# Patient Record
Sex: Male | Born: 1950 | Race: Black or African American | Hispanic: No | Smoking: Current every day smoker
Health system: Southern US, Community
[De-identification: ages and names within clinical notes are randomized; demographics above are authoritative.]

## PROBLEM LIST (undated history)

## (undated) DIAGNOSIS — I10 Essential (primary) hypertension: Secondary | ICD-10-CM

## (undated) DIAGNOSIS — N289 Disorder of kidney and ureter, unspecified: Secondary | ICD-10-CM

## (undated) DIAGNOSIS — B1920 Unspecified viral hepatitis C without hepatic coma: Secondary | ICD-10-CM

## (undated) DIAGNOSIS — N183 Chronic kidney disease, stage 3 unspecified: Secondary | ICD-10-CM

## (undated) HISTORY — PX: FOOT FRACTURE SURGERY: SHX645

---

## 2000-10-30 ENCOUNTER — Encounter: Payer: Self-pay | Admitting: Emergency Medicine

## 2000-10-30 ENCOUNTER — Emergency Department (HOSPITAL_COMMUNITY): Admission: EM | Admit: 2000-10-30 | Discharge: 2000-10-30 | Payer: Self-pay | Admitting: Emergency Medicine

## 2003-09-07 ENCOUNTER — Emergency Department (HOSPITAL_COMMUNITY): Admission: EM | Admit: 2003-09-07 | Discharge: 2003-09-07 | Payer: Self-pay | Admitting: Family Medicine

## 2003-11-02 ENCOUNTER — Encounter: Admission: RE | Admit: 2003-11-02 | Discharge: 2003-11-02 | Payer: Self-pay | Admitting: Family Medicine

## 2010-12-24 ENCOUNTER — Inpatient Hospital Stay (INDEPENDENT_AMBULATORY_CARE_PROVIDER_SITE_OTHER)
Admission: RE | Admit: 2010-12-24 | Discharge: 2010-12-24 | Disposition: A | Discharge status: Home / Self Care | Payer: Self-pay | Source: Ambulatory Visit | Attending: Family Medicine | Admitting: Family Medicine

## 2010-12-24 DIAGNOSIS — Z760 Encounter for issue of repeat prescription: Secondary | ICD-10-CM

## 2013-07-29 ENCOUNTER — Ambulatory Visit: Payer: Self-pay | Admitting: Family Medicine

## 2013-07-29 VITALS — BP 124/80 | HR 90 | Temp 98.0°F | Resp 16 | Ht 68.5 in | Wt 191.0 lb

## 2013-07-29 DIAGNOSIS — Z0289 Encounter for other administrative examinations: Secondary | ICD-10-CM

## 2013-07-29 NOTE — Patient Instructions (Signed)
You did have protein and some blood noted on your screening urine test today, but blood pressure is controlled. Follow up with primary care provoder and kidney doctor as scheduled. Return to the clinic or go to the nearest emergency room if any of your symptoms worsen or new symptoms occur. 1 year card. Try to bring a letter from your nephrologist regarding the status of your kidney disease to your next DOT physical.

## 2013-07-29 NOTE — Progress Notes (Addendum)
Subjective:    Patient ID: Nicholas Roach, male    DOB: [birth year], 63 y.o.   MRN: [id number] This chart was scribed for Merri Ray, MD by Vernell Barrier, Medical Scribe. This patient's care was started at 10:34 AM.  HPI HPI Comments: Nicholas Roach is a 63 y.o. male who presents to the [facility] in need of an employment physical. Pt has HTN and Kidney disease. Pt has been taking medication for both. He reports he currently has 40-45% function of his kidneys, and has been stable without new meds . Pt checks blood pressure regularly, usually runs around 120/90. Pt was diagnosed with a heart murmur last year by his PCP; states he was told it was nothing to worry about. Denies lightheadedness, dizziness with taking medication. Pt denies chest pain, SOB, trouble breathing, weakness, abdominal pain. Pt wears glasses and denies any difficulty seeing at night or any glares. Pt denies any difficulty sleeping, snoring, or apnea. Pt denies hx of heart disease, or eye disease.    There are no active problems to display for this patient.  No past medical history on file. No past surgical history on file. No Known Allergies Prior to Admission medications   Medication Sig Start Date End Date Taking? Authorizing Provider  amLODipine (NORVASC) 10 MG tablet Take 10 mg by mouth daily.   Yes Historical Provider, MD  aspirin 81 MG tablet Take 81 mg by mouth daily.   Yes Historical Provider, MD  omega-3 acid ethyl esters (LOVAZA) 1 G capsule Take by mouth 2 (two) times daily.   Yes Historical Provider, MD  sertraline (ZOLOFT) 100 MG tablet Take 100 mg by mouth daily.   Yes Historical Provider, MD   History   Social History  . Marital Status: Legally Separated    Spouse Name: N/A    Number of Children: N/A  . Years of Education: N/A   Occupational History  . Not on file.   Social History Main Topics  . Smoking status: Former Research scientist (life sciences)  . Smokeless tobacco: Not on file  .  Alcohol Use: Not on file  . Drug Use: Not on file  . Sexual Activity: Not on file   Other Topics Concern  . Not on file   Social History Narrative  . No narrative on file    Review of Systems  Respiratory: Negative for apnea and shortness of breath.   Cardiovascular: Negative for chest pain.  Gastrointestinal: Negative for abdominal pain.  Neurological: Negative for dizziness and light-headedness.   Negative other than as listed on medical exam report history. See scanned copy.     Objective:   Physical Exam  Vitals reviewed. Constitutional: He is oriented to person, place, and time. He appears well-developed and well-nourished.  HENT:  Head: Normocephalic and atraumatic.  Right Ear: External ear normal.  Left Ear: External ear normal.  Mouth/Throat: Oropharynx is clear and moist.  Eyes: Conjunctivae and EOM are normal. Pupils are equal, round, and reactive to light.  Neck: Normal range of motion. Neck supple. No JVD present. Carotid bruit is not present. No thyromegaly present.  Cardiovascular: Normal rate, regular rhythm and intact distal pulses.   Murmur (1-2 of 6, best heard at left sternal border) heard. Pulmonary/Chest: Effort normal and breath sounds normal. No respiratory distress. He has no wheezes. He has no rales.  Abdominal: Soft. He exhibits no distension. There is no tenderness. Hernia confirmed negative in the right inguinal area and confirmed negative in  the left inguinal area.  Genitourinary: Prostate normal.  Musculoskeletal: Normal range of motion. He exhibits no edema and no tenderness.  Lymphadenopathy:    He has no cervical adenopathy.  Neurological: He is alert and oriented to person, place, and time. He has normal reflexes.  Skin: Skin is warm and dry.  Psychiatric: He has a normal mood and affect. His behavior is normal.    Filed Vitals:   07/29/13 1020  BP: 124/80  Pulse: 90  Temp: 98 F (36.7 C)  TempSrc: Oral  Resp: 16  Height: 5' 8.5"  (1.74 m)  Weight: 191 lb (86.637 kg)  SpO2: 96%    Visual Acuity Screening   Right eye Left eye Both eyes  Without correction:     With correction: 20/20 20/15 20/13   Comments: Peripheral vision 85 degrees both eyesColor test 8 colors identified  Hearing Screening Comments: Whisper test 9 feet both ears        Assessment & Plan:   Nicholas Roach is a 63 y.o. male Health examination of defined subpopulation  DOT physical - see ppwk. 1 year card with htn ,and CKD - stable by report.  Discussed proteinuria, hematuria, and follow up with PCP/nephrologist.   Patient Instructions  You did have protein and some blood noted on your screening urine test today, but blood pressure is controlled. Follow up with primary care provoder and kidney doctor as scheduled. Return to the clinic or go to the nearest emergency room if any of your symptoms worsen or new symptoms occur. 1 year card. Try to bring a letter from your nephrologist regarding the status of your kidney disease to your next DOT physical.   I personally performed the services described in this documentation, which was scribed in my presence. The recorded information has been reviewed and considered, and addended by me as needed.

## 2014-02-18 ENCOUNTER — Emergency Department (HOSPITAL_COMMUNITY)
Admission: EM | Admit: 2014-02-18 | Discharge: 2014-02-18 | Disposition: A | Discharge status: Home / Self Care | Payer: Non-veteran care | Attending: Emergency Medicine | Admitting: Emergency Medicine

## 2014-02-18 ENCOUNTER — Encounter (HOSPITAL_COMMUNITY): Payer: Self-pay | Admitting: Emergency Medicine

## 2014-02-18 ENCOUNTER — Emergency Department (HOSPITAL_COMMUNITY): Payer: Non-veteran care

## 2014-02-18 DIAGNOSIS — Z79899 Other long term (current) drug therapy: Secondary | ICD-10-CM | POA: Insufficient documentation

## 2014-02-18 DIAGNOSIS — N183 Chronic kidney disease, stage 3 unspecified: Secondary | ICD-10-CM | POA: Insufficient documentation

## 2014-02-18 DIAGNOSIS — I129 Hypertensive chronic kidney disease with stage 1 through stage 4 chronic kidney disease, or unspecified chronic kidney disease: Secondary | ICD-10-CM | POA: Insufficient documentation

## 2014-02-18 DIAGNOSIS — M549 Dorsalgia, unspecified: Secondary | ICD-10-CM | POA: Insufficient documentation

## 2014-02-18 DIAGNOSIS — F17200 Nicotine dependence, unspecified, uncomplicated: Secondary | ICD-10-CM | POA: Insufficient documentation

## 2014-02-18 DIAGNOSIS — Z7982 Long term (current) use of aspirin: Secondary | ICD-10-CM | POA: Insufficient documentation

## 2014-02-18 DIAGNOSIS — M545 Low back pain, unspecified: Secondary | ICD-10-CM | POA: Insufficient documentation

## 2014-02-18 DIAGNOSIS — Z8619 Personal history of other infectious and parasitic diseases: Secondary | ICD-10-CM | POA: Insufficient documentation

## 2014-02-18 HISTORY — DX: Unspecified viral hepatitis C without hepatic coma: B19.20

## 2014-02-18 HISTORY — DX: Disorder of kidney and ureter, unspecified: N28.9

## 2014-02-18 HISTORY — DX: Essential (primary) hypertension: I10

## 2014-02-18 HISTORY — DX: Chronic kidney disease, stage 3 unspecified: N18.30

## 2014-02-18 HISTORY — DX: Chronic kidney disease, stage 3 (moderate): N18.3

## 2014-02-18 LAB — CBC WITH DIFFERENTIAL/PLATELET
Basophils Absolute: 0 10*3/uL (ref 0.0–0.1)
Basophils Relative: 1 % (ref 0–1)
Eosinophils Absolute: 0.2 10*3/uL (ref 0.0–0.7)
Eosinophils Relative: 2 % (ref 0–5)
HEMATOCRIT: 38.9 % — AB (ref 39.0–52.0)
Hemoglobin: 13.6 g/dL (ref 13.0–17.0)
LYMPHS PCT: 29 % (ref 12–46)
Lymphs Abs: 1.8 10*3/uL (ref 0.7–4.0)
MCH: 30 pg (ref 26.0–34.0)
MCHC: 35 g/dL (ref 30.0–36.0)
MCV: 85.9 fL (ref 78.0–100.0)
MONO ABS: 0.5 10*3/uL (ref 0.1–1.0)
MONOS PCT: 8 % (ref 3–12)
Neutro Abs: 3.8 10*3/uL (ref 1.7–7.7)
Neutrophils Relative %: 60 % (ref 43–77)
Platelets: 171 10*3/uL (ref 150–400)
RBC: 4.53 MIL/uL (ref 4.22–5.81)
RDW: 14.9 % (ref 11.5–15.5)
WBC: 6.3 10*3/uL (ref 4.0–10.5)

## 2014-02-18 LAB — BASIC METABOLIC PANEL
Anion gap: 10 (ref 5–15)
BUN: 29 mg/dL — AB (ref 6–23)
CHLORIDE: 102 meq/L (ref 96–112)
CO2: 26 meq/L (ref 19–32)
CREATININE: 2.51 mg/dL — AB (ref 0.50–1.35)
Calcium: 9.2 mg/dL (ref 8.4–10.5)
GFR calc Af Amer: 30 mL/min — ABNORMAL LOW (ref >90)
GFR calc non Af Amer: 26 mL/min — ABNORMAL LOW (ref >90)
Glucose, Bld: 97 mg/dL (ref 70–99)
Potassium: 4.5 mEq/L (ref 3.7–5.3)
Sodium: 138 mEq/L (ref 137–147)

## 2014-02-18 MED ORDER — HYDROCODONE-ACETAMINOPHEN 5-325 MG PO TABS: 2.0000 | ORAL_TABLET | Freq: Once | ORAL | Status: DC

## 2014-02-18 MED ORDER — HYDROMORPHONE HCL PF 1 MG/ML IJ SOLN
1.0000 mg | Freq: Once | INTRAMUSCULAR | Status: AC
  Administered 2014-02-18: 1 mg via INTRAMUSCULAR
  Filled 2014-02-18: qty 1

## 2014-02-18 MED ORDER — DIAZEPAM 5 MG PO TABS: 5.0000 mg | ORAL_TABLET | Freq: Once | ORAL | Status: DC

## 2014-02-18 MED ORDER — CYCLOBENZAPRINE HCL 10 MG PO TABS
10.0000 mg | ORAL_TABLET | Freq: Once | ORAL | Status: AC
  Administered 2014-02-18: 10 mg via ORAL
  Filled 2014-02-18: qty 1

## 2014-02-18 MED ORDER — OXYCODONE-ACETAMINOPHEN 5-325 MG PO TABS: 1.0000 | ORAL_TABLET | Freq: Four times a day (QID) | ORAL | Status: DC | PRN

## 2014-02-18 MED ORDER — HYDROCODONE-ACETAMINOPHEN 5-325 MG PO TABS
1.0000 | ORAL_TABLET | Freq: Once | ORAL | Status: AC
  Administered 2014-02-18: 1 via ORAL
  Filled 2014-02-18: qty 1

## 2014-02-18 MED ORDER — DIAZEPAM 5 MG PO TABS: 5.0000 mg | ORAL_TABLET | Freq: Two times a day (BID) | ORAL | Status: DC

## 2014-02-18 NOTE — ED Notes (Signed)
Bed: YB33 Expected date:  Expected time:  Means of arrival:  Comments: EMS 63yo M rt lower back / hip pain

## 2014-02-18 NOTE — ED Notes (Signed)
Pt ambulated to and from bathroom without assistance.  Pt c/o pain and spasms with movement.  Pt's gait was steady.

## 2014-02-18 NOTE — ED Provider Notes (Signed)
Patient signed out to me by Deborah Chalk, PA-C at change of shift. Patient with back pain awaiting MRI. Likely discharge home.   MRI results show large right foraminal disc extrusion at L2-3 affecting the right L2 nerve. This likely the cause of patient's pain. Patient reexamined. He is uncomfortable feeling without neuro deficit. Patient reports too much pain to walk. Will attempt more aggressive pain control.   Patient given Dilaudid 1mg  IM. Patient feels improved and is able to ambulate, but with pain. Will discharge home with percocet and valium. Patient has follow up at the [facility]. Orthopedic follow up given. Dicussed reasons to return to ED immediately. Vital signs stable for discharge. Discussed case with Dr. Wilson Singer who agrees with plan. Patient / Family / Caregiver informed of clinical course, understand medical decision-making process, and agree with plan.   Results for orders placed during the hospital encounter of 02/18/14  CBC WITH DIFFERENTIAL      Result Value Ref Range   WBC 6.3  4.0 - 10.5 K/uL   RBC 4.53  4.22 - 5.81 MIL/uL   Hemoglobin 13.6  13.0 - 17.0 g/dL   HCT 38.9 (*) 39.0 - 52.0 %   MCV 85.9  78.0 - 100.0 fL   MCH 30.0  26.0 - 34.0 pg   MCHC 35.0  30.0 - 36.0 g/dL   RDW 14.9  11.5 - 15.5 %   Platelets 171  150 - 400 K/uL   Neutrophils Relative % 60  43 - 77 %   Neutro Abs 3.8  1.7 - 7.7 K/uL   Lymphocytes Relative 29  12 - 46 %   Lymphs Abs 1.8  0.7 - 4.0 K/uL   Monocytes Relative 8  3 - 12 %   Monocytes Absolute 0.5  0.1 - 1.0 K/uL   Eosinophils Relative 2  0 - 5 %   Eosinophils Absolute 0.2  0.0 - 0.7 K/uL   Basophils Relative 1  0 - 1 %   Basophils Absolute 0.0  0.0 - 0.1 K/uL  BASIC METABOLIC PANEL      Result Value Ref Range   Sodium 138  137 - 147 mEq/L   Potassium 4.5  3.7 - 5.3 mEq/L   Chloride 102  96 - 112 mEq/L   CO2 26  19 - 32 mEq/L   Glucose, Bld 97  70 - 99 mg/dL   BUN 29 (*) 6 - 23 mg/dL   Creatinine, Ser 2.51 (*) 0.50 - 1.35 mg/dL   Calcium  9.2  8.4 - 10.5 mg/dL   GFR calc non Af Amer 26 (*) >90 mL/min   GFR calc Af Amer 30 (*) >90 mL/min   Anion gap 10  5 - 15   Mr Lumbar Spine Wo Contrast  02/18/2014   CLINICAL DATA:  Sudden onset of low back pain yesterday. No IV contrast was administered due to diminished renal function (GFR 30, creatinine 2.5).  EXAM: MRI LUMBAR SPINE WITHOUT CONTRAST  TECHNIQUE: Multiplanar, multisequence MR imaging of the lumbar spine was performed. No intravenous contrast was administered.  COMPARISON:  None.  FINDINGS: For the purposes of this dictation, the lowest well-formed intervertebral disc space is considered to be L5-S1.  There is mild anterior wedging of the T12 vertebral body without marrow edema, likely chronic. Slight, chronic appearing anterior wedging of the L1 vertebral body is also noted. There is mild thoracolumbar levoscoliosis. There is no significant listhesis. Conus medullaris is normal in signal and terminates at L1-2. There is  no evidence of epidural fluid collection.  3.3 cm T2 hyperintense lesion in the lower pole of the right kidney most likely represents a cyst. Small T2 hyperintense lesion is partially visualized in the lower pole of the left kidney, incompletely evaluated.  L1-2:  Negative.  L2-3: Large right foraminal disc extrusion results in moderate right neural foraminal stenosis with mass effect on the exiting right L2 nerve. 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  L3-4: Mild disc bulge and mild facet arthrosis result in mild left greater than right lateral recess stenosis and mild left neural foraminal stenosis.  L4-5: Circumferential disc bulge with superimposed, small left central disc extrusion and mild facet hypertrophy result in mild bilateral lateral recess and neural foraminal narrowing.  L5-S1: Mild disc bulge with superimposed, shallow right subarticular disc protrusion and mild facet hypertrophy result in mild right  lateral recess stenosis and mild bilateral neural foraminal stenosis.  IMPRESSION: 1. 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 2. 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   Electronically Signed   By: Logan Bores   On: 02/18/2014 10:39     Elwyn Lade, PA-C 02/18/14 1310

## 2014-02-18 NOTE — ED Notes (Signed)
Back from testing.

## 2014-02-18 NOTE — ED Notes (Signed)
Attempted to reach MRI-no answer

## 2014-02-18 NOTE — Discharge Instructions (Signed)
Back Pain, Adult Low back pain is very common. About 1 in 5 people have back pain.The cause of low back pain is rarely dangerous. The pain often gets better over time.About half of people with a sudden onset of back pain feel better in just 2 weeks. About 8 in 10 people feel better by 6 weeks.  CAUSES Some common causes of back pain include:  Strain of the muscles or ligaments supporting the spine.  Wear and tear (degeneration) of the spinal discs.  Arthritis.  Direct injury to the back. DIAGNOSIS Most of the time, the direct cause of low back pain is not known.However, back pain can be treated effectively even when the exact cause of the pain is unknown.Answering your caregiver's questions about your overall health and symptoms is one of the most accurate ways to make sure the cause of your pain is not dangerous. If your caregiver needs more information, he or she may order lab work or imaging tests (X-rays or MRIs).However, even if imaging tests show changes in your back, this usually does not require surgery. HOME CARE INSTRUCTIONS For many people, back pain returns.Since low back pain is rarely dangerous, it is often a condition that people can learn to [facility] their own.   Remain active. It is stressful on the back to sit or stand in one place. Do not sit, drive, or stand in one place for more than 30 minutes at a time. Take short walks on level surfaces as soon as pain allows.Try to increase the length of time you walk each day.  Do not stay in bed.Resting more than 1 or 2 days can delay your recovery.  Do not avoid exercise or work.Your body is made to move.It is not dangerous to be active, even though your back may hurt.Your back will likely heal faster if you return to being active before your pain is gone.  Pay attention to your body when you bend and lift. Many people have less discomfortwhen lifting if they bend their knees, keep the load close to their bodies,and  avoid twisting. Often, the most comfortable positions are those that put less stress on your recovering back.  Find a comfortable position to sleep. Use a firm mattress and lie on your side with your knees slightly bent. If you lie on your back, put a pillow under your knees.  Only take over-the-counter or prescription medicines as directed by your caregiver. Over-the-counter medicines to reduce pain and inflammation are often the most helpful.Your caregiver may prescribe muscle relaxant drugs.These medicines help dull your pain so you can more quickly return to your normal activities and healthy exercise.  Put ice on the injured area.  Put ice in a plastic bag.  Place a towel between your skin and the bag.  Leave the ice on for 15-20 minutes, 03-04 times a day for the first 2 to 3 days. After that, ice and heat may be alternated to reduce pain and spasms.  Ask your caregiver about trying back exercises and gentle massage. This may be of some benefit.  Avoid feeling anxious or stressed.Stress increases muscle tension and can worsen back pain.It is important to recognize when you are anxious or stressed and learn ways to manage it.Exercise is a great option. SEEK MEDICAL CARE IF:  You have pain that is not relieved with rest or medicine.  You have pain that does not improve in 1 week.  You have new symptoms.  You are generally not feeling well. SEEK  IMMEDIATE MEDICAL CARE IF:   You have pain that radiates from your back into your legs.  You develop new bowel or bladder control problems.  You have unusual weakness or numbness in your arms or legs.  You develop nausea or vomiting.  You develop abdominal pain.  You feel faint. Document Released: 07/14/2005 Document Revised: 01/13/2012 Document Reviewed: 11/15/2013 [facility] Patient Information 2015 [place], [place]. This information is not intended to replace advice given to you by your health care provider. Make sure you  discuss any questions you have with your health care provider.   Emergency Department Resource Guide 1) Find a Doctor and Pay Out of Pocket Although you won't have to find out who is covered by your insurance plan, it is a good idea to ask around and get recommendations. You will then need to call the office and see if the doctor you have chosen will accept you as a new patient and what types of options they offer for patients who are self-pay. Some doctors offer discounts or will set up payment plans for their patients who do not have insurance, but you will need to ask so you aren't surprised when you get to your appointment.  2) Contact Your Local Health Department Not all health departments have doctors that can see patients for sick visits, but many do, so it is worth a call to see if yours does. If you don't know where your local health department is, you can check in your phone book. The CDC also has a tool to help you locate your state's health department, and many state websites also have listings of all of their local health departments.  3) Find a [facility] If your illness is not likely to be very severe or complicated, you may want to try a walk in clinic. These are popping up all over the country in pharmacies, drugstores, and shopping centers. They're usually staffed by nurse practitioners or physician assistants that have been trained to treat common illnesses and complaints. They're usually fairly quick and inexpensive. However, if you have serious medical issues or chronic medical problems, these are probably not your best option.  No Primary Care Doctor: - Call Health Connect at  [phone] - they can help you locate a primary care doctor that  accepts your insurance, provides certain services, etc. - Physician Referral Service- [phone]  Chronic Pain Problems: Organization         Address  Phone   Notes  [facility]  [phone] Patients need to  be referred by their primary care doctor.   Medication Assistance: Organization         Address  Phone   Notes  [facility] Medication [facility] [address] [place], [place] [address] [phone] --Must be a resident of [facility] -- Must have NO insurance coverage whatsoever (no Medicaid/ Medicare, etc.) -- The pt. MUST have a primary care doctor that directs their care regularly and follows them in the community   MedAssist  [phone]   [employer]  [phone]    Agencies that provide inexpensive medical care: Organization         Address  Phone   Notes  [place]  [phone]   [facility]    [phone]   [facility] [place], [place] [address] [phone]   [place] [address]  [place] [phone]   Planned Parenthood    [phone]   [facility]    [phone]   Community Health and [place] [address], [place] Phone:  [phone], Fax:  [phone] Hours of Operation:  9 am - 6 pm, M-F.  Also accepts Medicaid/Medicare and self-pay.  [facility] for [place] [place], [address], [place] Phone: [phone], Fax: [phone]. Hours of Operation:  8:30 am - 5:30 pm, M-F.  Also accepts Medicaid and self-pay.  [facility] [place] [address], [place] Phone: [phone]   [place], [place], [place] [phone], Ext. [phone] Mondays & Thursdays: 7-9 AM.  First 15 patients are seen on a first come, first serve basis.    [place] Providers:  Organization         Address  Phone   Notes  [facility] [address], [place] [phone] Also accepts self-pay patients.  [facility] [address], [place]  [phone]   [place], [address], [place] [phone]   [facility] Medicine [address] [place] [phone]   Lucianne Lei [address], [place]   [phone] Only accepts [place] Access [place] patients after they have their name applied to their card.   Self-Pay (no insurance) in [facility]:  Organization         Address  Phone   Notes  Sickle Cell Patients, [facility] Internal [facility] [place] [phone]   [facility] [place] [phone]   [facility] [place]  [place], [place], [place] [phone]   [facility]/Dr. Osei-Bonsu  [address], [place] or [address], [place] [phone] Phone number for both [place] and [place] locations is the same.  [facility] [address], [place] [phone]   [facility] [address] [place] or [address] [phone]   [facility] [address] [place] [phone], phone; [phone], fax Sees patients 1st and 3rd Saturday of every month.  Must not qualify for public or private insurance (i.e. Medicaid, Medicare, [place] Health Choice, Veterans' Benefits)  Household income should be no more than 200% of the poverty level The clinic cannot treat you if you are pregnant or think you are pregnant  Sexually transmitted diseases are not treated at the clinic.    Dental Care: Organization         Address  Phone  Notes  [facility] Department of [facility] [place] [phone] Accepts children up to age 110 who are enrolled in [place] or [place]; pregnant women with a Medicaid card; and children who have applied for Medicaid or [place] Health Choice, but were declined, whose parents can  pay a reduced fee at time of service.  [facility] of [facility]  [address], [place] [phone] Accepts children up to age 35 who are enrolled in [place] or [place]; pregnant women with a Medicaid card; and children who have applied for Medicaid or [place]  Health Choice, but were declined, whose parents can pay a reduced fee at time of service.  [place] Adult Dental Access PROGRAM  [place] [phone] Patients are seen by appointment only. Walk-ins are not accepted. [place] will see patients 54 years of age and older. Monday - Tuesday (8am-5pm) Most Wednesdays (8:30-5pm) $30 per visit, cash only  [facility] Adult Dental Access PROGRAM  [address], [facility] [phone] Patients are seen by appointment only. Walk-ins are not accepted. [place] will see patients 74 years of age and older. One Wednesday Evening (Monthly: Volunteer Based).  $30 per visit, cash only  [place]  [phone] for adults; Children under age 11, call Graduate Pediatric Dentistry at [phone]. Children aged 13-14, please call [phone] to request a pediatric application.  Dental services are provided in all areas of dental care including fillings, crowns and bridges, complete and partial dentures, implants, gum treatment, root canals, and extractions. Preventive care is also provided. Treatment is provided to both adults and children. Patients are selected via a lottery and there is often a waiting list.   [place] [address], [place]  [phone] [email or web address]   [facility] [address] [place], [place] [phone], Ext. [phone] Second and Fourth Thursday of each month, opens at 6:30 AM; Clinic ends at 9 AM.  Patients are seen on a first-come first-served basis, and a limited number are seen during each clinic.   [facility]  [address] Hillard Danker [place], [place] [phone]   Eligibility Requirements You must have lived in [place], [place], or [place] counties for at least the last three months.   You cannot be eligible for state or federal sponsored [employer], including [employer], [place], or [employer].   You generally cannot be eligible for healthcare insurance through your employer.    How to apply: Eligibility screenings are held every Tuesday and Wednesday afternoon from 1:00 pm until 4:00 pm. You do not need an appointment for the interview!  [facility] [address], [place], [place]   [place]  [place] Department  [place]  [phone]    Behavioral Health Resources in the Community: Intensive Outpatient Programs Organization         Address  Phone  Notes  [place]. [address] [place], [place] [phone]   [facility] Outpatient [address] [place], [place]   ADS: Alcohol & Drug Svcs [address], [place], [place]   [place] [address]  [place], [place] or [phone]   Substance Abuse Resources Organization         Address  Phone  Notes  Alcohol and Drug Services  [phone]   [place]  [phone]   [facility] [place]   Chinita Pester  [phone]   Residential & Outpatient Substance Abuse Program  [phone]   Psychological Services Organization         Address  Phone  Notes  [facility] [place]  [place]  [phone]   [place] [address] [place] or [phone]    Mobile Crisis Teams Organization         Address  Phone  Notes  [facility], Mobile Crisis Care Unit  [phone]    [facility]ive Psychotherapeutic Services  [address] [place], [place]   [facility] [address], [place] [phone]    Self-Help/Support Groups Organization         Address  Phone             Notes  [place]. of [place] - variety of support groups  [place] Call for more information  Narcotics Anonymous (NA), [facility] [address], [employer] Muse  2 meetings at this location   Special educational needs teacher         Address  Phone  Notes  [facility] Residential Treatment [place],    [place]  [phone]   [facility]  [address] [place] [address], [place], [place]   [place], [place] [phone] Admissions: 8am-3pm M-F  Incentives Substance [place] [address]    [place], [place] [phone]   [facility] [address] [place], [place], [place]   [facility] [address],  [place], [place]   [facility] - Intensive Outpatient [address], [place], [place]   [facility] [place].,  [place], [place] [phone] or [phone]   [facility] Services (RTS) [address], [place], [place] Accepts Medicaid  [facility] [place] [address]  [place] [phone] Substance Abuse/Addiction Treatment   [facility] Organization         Address  Phone  Notes  [facility]  [phone]   Domenic Schwab, PhD [address] Arlis [place], [place]   [phone] or [phone]   [place] Elbert Ripley [place], [place] [phone]   [facility] [address], [place], [place] [phone] Insurance/Medicaid/sponsorship through [facility] and Families [address], [address]                                     [place], [place] [phone] [facility] [address], [place] [phone]    Dr. Adele Schilder  [phone]   [facility] Dept. 1) [address], [place] 2) [place] 3)  [place] [address], [place] [phone]  [phone]   [place] [phone] or [phone] (After Hours)

## 2014-02-18 NOTE — ED Notes (Signed)
Off floor for testing 

## 2014-02-18 NOTE — ED Notes (Signed)
Per EMS pt got up to go to the bathroom at 0200 when his back began to spasm and the pain got so bad he had to sit down.  Pt is rating his pain 10/10 in his right lower back.  Denies any falls or trauma.  Moves all extremities purposefully, no incontinence of bowel or bladder.

## 2014-02-18 NOTE — ED Notes (Signed)
Attempted to reach MRI-no answer-per secretary, they were notified over a hour ago and are awareof order
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
## 2014-02-18 NOTE — ED Provider Notes (Signed)
CSN: [id number]     Arrival date & time 02/18/14  0300 History   First MD Initiated Contact with Patient 02/18/14 0309     Chief Complaint  Patient presents with  . Back Pain     (Consider location/radiation/quality/duration/timing/severity/associated sxs/prior Treatment) HPI Comments: Patient is a 63 year old male with a history of drug abuse, hepatitis C, hypertension, and chronic kidney disease who presents to the emergency department for back pain. Patient states he has had persistent back pain for the last week. Pain is sharp and stabbing in nature, located primarily to patient's right low back. Patient has been taking Tylenol for symptoms without relief. He states he cannot take NSAIDs secondary to his kidney disease and he has not taken narcotics secondary to his history of drug abuse. Patient denies any trauma or injury to his back. He further denies associated fever, bowel or bladder incontinence, saddle anesthesia or genital numbness, numbness, paresthesias, and extremity weakness.   Patient denies a history of similar back pain. He [facility]. Patient was worked up for a kidney stone, including a CT scan, all of which were negative.  Patient is a 63 y.o. male presenting with back pain. The history is provided by the patient. No language interpreter was used.  Back Pain   Past Medical History  Diagnosis Date  . Renal disorder   . CKD (chronic kidney disease) stage 3, GFR 30-59 ml/min   . Hypertension   . Hepatitis C    Past Surgical History  Procedure Laterality Date  . Foot fracture surgery Left    History reviewed. No pertinent family history. History  Substance Use Topics  . Smoking status: Current Every Day Smoker  . Smokeless tobacco: Not on file  . Alcohol Use: Yes     Comment: Occasionally    Review of Systems  Musculoskeletal: Positive for back pain.  All other systems reviewed and are negative.    Allergies   Review of patient's allergies indicates no known allergies.  Home Medications   Prior to Admission medications   Medication Sig Start Date End Date Taking? Authorizing Provider  acetaminophen (TYLENOL) 500 MG tablet Take 1,000 mg by mouth every 6 (six) hours as needed for mild pain.   Yes Historical Provider, MD  amLODipine (NORVASC) 10 MG tablet Take 10 mg by mouth daily.   Yes Historical Provider, MD  aspirin 81 MG tablet Take 81 mg by mouth daily.   Yes Historical Provider, MD  cholecalciferol (VITAMIN D) 1000 UNITS tablet Take 2,000 Units by mouth 2 (two) times daily.   Yes Historical Provider, MD  doxazosin (CARDURA) 8 MG tablet Take 8 mg by mouth daily.   Yes Historical Provider, MD  omega-3 acid ethyl esters (LOVAZA) 1 G capsule Take 1 g by mouth 2 (two) times daily.    Yes Historical Provider, MD  sertraline (ZOLOFT) 100 MG tablet Take 100 mg by mouth daily.   Yes Historical Provider, MD   BP 162/104  Pulse 72  Temp(Src) 98 F (36.7 C) (Oral)  Resp 18  Ht 5\' 10"  (1.778 m)  Wt 185 lb (83.915 kg)  BMI 26.54 kg/m2  SpO2 98%  Physical Exam  Nursing note and vitals reviewed. Constitutional: He is oriented to person, place, and time. He appears well-developed and well-nourished. No distress.  Nontoxic/nonseptic appearing. Patient writhing around in bed, he appears uncomfortable  HENT:  Head: Normocephalic and atraumatic.  Eyes: Conjunctivae and EOM are normal.  No scleral icterus.  Neck: Normal range of motion.  Cardiovascular: Normal rate, regular rhythm and intact distal pulses.   Pulmonary/Chest: Effort normal. No respiratory distress. He has no wheezes.  Chest expansion symmetric  Musculoskeletal: He exhibits tenderness.  Decreased range of motion of low back secondary to pain. Tenderness to palpation of the right lumbar paraspinal muscles with mild spasm. Negative straight leg raise and positive cross straight leg raise. No step-offs, crepitus, or bony deformities  appreciated to thoracic or lumbar midline.  Neurological: He is alert and oriented to person, place, and time. He exhibits normal muscle tone. Coordination normal.  No gross sensory deficits appreciated. Patellar and Achilles reflexes 2+ bilaterally.  Skin: Skin is warm and dry. No rash noted. He is not diaphoretic. No erythema. No pallor.  Psychiatric: He has a normal mood and affect. His behavior is normal.    ED Course  Procedures (including critical care time) Labs Review Labs Reviewed  CBC WITH DIFFERENTIAL  BASIC METABOLIC PANEL    Imaging Review No results found.   EKG Interpretation None      MDM   Final diagnoses:  Right-sided low back pain without sciatica    63 year old male with no known history of back pain presents to the emergency department for worsening back pain x1 week. Neg KUB [facility]. Patient neurovascularly intact. No red flags or signs concerning for cauda equina. No fever. Patient denies numbness, paresthesias, and extremity weakness; however, patient does have a history of drug abuse with most recent use 3 months ago. Given patient's history of substance abuse and no known history of back pain, will further evaluate with MRI. Patient has been mildly improved over ED course with one tablet Norco and Flexeril. Patient is, overall, fairly resistant to treatment with narcotics given his addiction history.  Patient care signed out to Hardie Lora, PA-C at shift change who will follow up on imaging. If MR imaging negative, anticipate discharge home with orthopedic referral and prescription for Flexeril.    Antonietta Breach, PA-C 02/18/14 [phone]

## 2014-02-20 ENCOUNTER — Emergency Department (HOSPITAL_COMMUNITY)
Admission: EM | Admit: 2014-02-20 | Discharge: 2014-02-20 | Discharge status: Against Medical Advice | Payer: Non-veteran care

## 2014-02-22 NOTE — ED Provider Notes (Signed)
Medical screening examination/treatment/procedure(s) were performed by non-physician practitioner and as supervising physician I was immediately available for consultation/collaboration.   EKG Interpretation None       Virgel Manifold, MD 02/22/14 1346

## 2016-05-16 ENCOUNTER — Ambulatory Visit
Admission: RE | Admit: 2016-05-16 | Discharge: 2016-05-16 | Disposition: A | Discharge status: Home / Self Care | Payer: Non-veteran care | Source: Ambulatory Visit | Attending: Nephrology | Admitting: Nephrology

## 2016-05-16 ENCOUNTER — Other Ambulatory Visit: Payer: Self-pay | Admitting: Nephrology

## 2016-05-16 DIAGNOSIS — R059 Cough, unspecified: Secondary | ICD-10-CM

## 2016-05-16 DIAGNOSIS — R05 Cough: Secondary | ICD-10-CM

## 2016-05-16 DIAGNOSIS — R042 Hemoptysis: Secondary | ICD-10-CM

## 2016-05-21 ENCOUNTER — Encounter (HOSPITAL_COMMUNITY): Payer: Self-pay | Admitting: Emergency Medicine

## 2016-05-21 ENCOUNTER — Inpatient Hospital Stay (HOSPITAL_COMMUNITY): Payer: Non-veteran care

## 2016-05-21 ENCOUNTER — Inpatient Hospital Stay (HOSPITAL_COMMUNITY)
Admission: EM | Admit: 2016-05-21 | Discharge: 2016-05-21 | DRG: 683 | Discharge status: Against Medical Advice | Payer: Non-veteran care | Attending: Internal Medicine | Admitting: Internal Medicine

## 2016-05-21 DIAGNOSIS — Z992 Dependence on renal dialysis: Secondary | ICD-10-CM

## 2016-05-21 DIAGNOSIS — D649 Anemia, unspecified: Secondary | ICD-10-CM | POA: Diagnosis present

## 2016-05-21 DIAGNOSIS — N186 End stage renal disease: Secondary | ICD-10-CM | POA: Diagnosis present

## 2016-05-21 DIAGNOSIS — Z8249 Family history of ischemic heart disease and other diseases of the circulatory system: Secondary | ICD-10-CM

## 2016-05-21 DIAGNOSIS — I120 Hypertensive chronic kidney disease with stage 5 chronic kidney disease or end stage renal disease: Secondary | ICD-10-CM | POA: Diagnosis present

## 2016-05-21 DIAGNOSIS — K921 Melena: Secondary | ICD-10-CM | POA: Diagnosis present

## 2016-05-21 DIAGNOSIS — Z79899 Other long term (current) drug therapy: Secondary | ICD-10-CM | POA: Diagnosis not present

## 2016-05-21 DIAGNOSIS — I150 Renovascular hypertension: Secondary | ICD-10-CM | POA: Diagnosis present

## 2016-05-21 DIAGNOSIS — R042 Hemoptysis: Secondary | ICD-10-CM | POA: Diagnosis present

## 2016-05-21 DIAGNOSIS — D631 Anemia in chronic kidney disease: Secondary | ICD-10-CM | POA: Diagnosis present

## 2016-05-21 DIAGNOSIS — K920 Hematemesis: Secondary | ICD-10-CM | POA: Diagnosis present

## 2016-05-21 DIAGNOSIS — F17200 Nicotine dependence, unspecified, uncomplicated: Secondary | ICD-10-CM | POA: Diagnosis present

## 2016-05-21 DIAGNOSIS — B1920 Unspecified viral hepatitis C without hepatic coma: Secondary | ICD-10-CM | POA: Diagnosis present

## 2016-05-21 HISTORY — DX: Disorder of kidney and ureter, unspecified: N28.9

## 2016-05-21 LAB — CBC WITH DIFFERENTIAL/PLATELET
BASOS ABS: 0.1 10*3/uL (ref 0.0–0.1)
BASOS PCT: 1 %
Eosinophils Absolute: 0.3 10*3/uL (ref 0.0–0.7)
Eosinophils Relative: 3 %
HEMATOCRIT: 21.5 % — AB (ref 39.0–52.0)
HEMOGLOBIN: 7 g/dL — AB (ref 13.0–17.0)
Lymphocytes Relative: 17 %
Lymphs Abs: 1.8 10*3/uL (ref 0.7–4.0)
MCH: 30.2 pg (ref 26.0–34.0)
MCHC: 32.6 g/dL (ref 30.0–36.0)
MCV: 92.7 fL (ref 78.0–100.0)
Monocytes Absolute: 0.9 10*3/uL (ref 0.1–1.0)
Monocytes Relative: 9 %
Neutro Abs: 7.1 10*3/uL (ref 1.7–7.7)
Neutrophils Relative %: 70 %
Platelets: 209 10*3/uL (ref 150–400)
RBC: 2.32 MIL/uL — ABNORMAL LOW (ref 4.22–5.81)
RDW: 20.9 % — ABNORMAL HIGH (ref 11.5–15.5)
WBC: 10.2 10*3/uL (ref 4.0–10.5)

## 2016-05-21 LAB — COMPREHENSIVE METABOLIC PANEL
ALBUMIN: 2.9 g/dL — AB (ref 3.5–5.0)
ALK PHOS: 66 U/L (ref 38–126)
ALT: 12 U/L — ABNORMAL LOW (ref 17–63)
AST: 20 U/L (ref 15–41)
Anion gap: 15 (ref 5–15)
BUN: 47 mg/dL — AB (ref 6–20)
CO2: 28 mmol/L (ref 22–32)
Calcium: 9 mg/dL (ref 8.9–10.3)
Chloride: 93 mmol/L — ABNORMAL LOW (ref 101–111)
Creatinine, Ser: 10.04 mg/dL — ABNORMAL HIGH (ref 0.61–1.24)
GFR calc Af Amer: 6 mL/min — ABNORMAL LOW (ref >60)
GFR calc non Af Amer: 5 mL/min — ABNORMAL LOW (ref >60)
GLUCOSE: 102 mg/dL — AB (ref 65–99)
POTASSIUM: 4.1 mmol/L (ref 3.5–5.1)
Sodium: 136 mmol/L (ref 135–145)
Total Bilirubin: 0.7 mg/dL (ref 0.3–1.2)
Total Protein: 6.7 g/dL (ref 6.5–8.1)

## 2016-05-21 LAB — CBC
HCT: 23 % — ABNORMAL LOW (ref 39.0–52.0)
Hemoglobin: 7.6 g/dL — ABNORMAL LOW (ref 13.0–17.0)
MCH: 30.3 pg (ref 26.0–34.0)
MCHC: 33 g/dL (ref 30.0–36.0)
MCV: 91.6 fL (ref 78.0–100.0)
PLATELETS: 185 10*3/uL (ref 150–400)
RBC: 2.51 MIL/uL — AB (ref 4.22–5.81)
RDW: 20.4 % — ABNORMAL HIGH (ref 11.5–15.5)
WBC: 10.8 10*3/uL — ABNORMAL HIGH (ref 4.0–10.5)

## 2016-05-21 LAB — RETICULOCYTES
RBC.: 2.51 MIL/uL — AB (ref 4.22–5.81)
RETIC COUNT ABSOLUTE: 200.8 10*3/uL — AB (ref 19.0–186.0)
RETIC CT PCT: 8 % — AB (ref 0.4–3.1)

## 2016-05-21 LAB — MRSA PCR SCREENING: MRSA BY PCR: POSITIVE — AB

## 2016-05-21 LAB — IRON AND TIBC
Iron: 44 ug/dL — ABNORMAL LOW (ref 45–182)
SATURATION RATIOS: 20 % (ref 17.9–39.5)
TIBC: 217 ug/dL — AB (ref 250–450)
UIBC: 173 ug/dL

## 2016-05-21 LAB — PREPARE RBC (CROSSMATCH)

## 2016-05-21 LAB — ABO/RH: ABO/RH(D): O POS

## 2016-05-21 LAB — FERRITIN: Ferritin: 337 ng/mL — ABNORMAL HIGH (ref 24–336)

## 2016-05-21 LAB — POC OCCULT BLOOD, ED: Fecal Occult Bld: POSITIVE — AB

## 2016-05-21 MED ORDER — SODIUM CHLORIDE 0.9 % IV SOLN
80.0000 mg | Freq: Once | INTRAVENOUS | Status: AC
  Administered 2016-05-21: 80 mg via INTRAVENOUS
  Filled 2016-05-21: qty 80

## 2016-05-21 MED ORDER — IOPAMIDOL (ISOVUE-300) INJECTION 61%
INTRAVENOUS | Status: AC
  Administered 2016-05-21: 75 mL
  Filled 2016-05-21: qty 75

## 2016-05-21 MED ORDER — CARVEDILOL 12.5 MG PO TABS
12.5000 mg | ORAL_TABLET | Freq: Two times a day (BID) | ORAL | Status: DC
  Administered 2016-05-21: 12.5 mg via ORAL
  Filled 2016-05-21: qty 1

## 2016-05-21 MED ORDER — CHLORHEXIDINE GLUCONATE CLOTH 2 % EX PADS: 6.0000 | MEDICATED_PAD | Freq: Every day | CUTANEOUS | Status: DC

## 2016-05-21 MED ORDER — AMLODIPINE BESYLATE 10 MG PO TABS: 10.0000 mg | ORAL_TABLET | Freq: Every day | ORAL | Status: DC

## 2016-05-21 MED ORDER — MUPIROCIN 2 % EX OINT
1.0000 "application " | TOPICAL_OINTMENT | Freq: Two times a day (BID) | CUTANEOUS | Status: DC
  Administered 2016-05-21: 1 via NASAL
  Filled 2016-05-21: qty 22

## 2016-05-21 MED ORDER — CALCIUM ACETATE (PHOS BINDER) 667 MG PO CAPS
1334.0000 mg | ORAL_CAPSULE | Freq: Three times a day (TID) | ORAL | Status: DC
  Filled 2016-05-21: qty 2

## 2016-05-21 MED ORDER — RENA-VITE PO TABS: 1.0000 | ORAL_TABLET | Freq: Every day | ORAL | Status: DC

## 2016-05-21 MED ORDER — DARBEPOETIN ALFA 200 MCG/0.4ML IJ SOSY: 200.0000 ug | PREFILLED_SYRINGE | INTRAMUSCULAR | Status: DC

## 2016-05-21 MED ORDER — SODIUM CHLORIDE 0.9 % IV SOLN
10.0000 mL/h | Freq: Once | INTRAVENOUS | Status: AC
  Administered 2016-05-21: 10 mL/h via INTRAVENOUS

## 2016-05-21 MED ORDER — DOXAZOSIN MESYLATE 2 MG PO TABS: 8.0000 mg | ORAL_TABLET | Freq: Every day | ORAL | Status: DC

## 2016-05-21 MED ORDER — SODIUM CHLORIDE 0.9 % IV SOLN: Freq: Once | INTRAVENOUS | Status: DC

## 2016-05-21 MED ORDER — CARVEDILOL 6.25 MG PO TABS: 6.2500 mg | ORAL_TABLET | Freq: Two times a day (BID) | ORAL | Status: DC

## 2016-05-21 MED ORDER — PANTOPRAZOLE SODIUM 40 MG IV SOLR: 40.0000 mg | Freq: Two times a day (BID) | INTRAVENOUS | Status: DC

## 2016-05-21 MED ORDER — DOXAZOSIN MESYLATE 2 MG PO TABS
8.0000 mg | ORAL_TABLET | Freq: Every day | ORAL | Status: DC
Start: 2016-05-22 — End: 2016-05-21

## 2016-05-21 NOTE — Progress Notes (Signed)
Nurse went into the patient's room to informed him that hemodialysis nurses would be getting him for his hemodialysis treatment.Patient asked ,''where is my food?''   Nurse explained to him that we need to wait for G.I. Doctor.Patient become angry and said'' I m going home,give me that paper that I need to sign to.

## 2016-05-21 NOTE — Progress Notes (Signed)
Patient was immediately seen by his primary doctor,spoke with the patient,afterward M.D. Said to the nurse that let him sign the A.M.A.,he is going to leave.

## 2016-05-21 NOTE — H&P (Signed)
History and Physical  Patient Name: Nicholas Roach     IOX:[id number]    DOB: [birth year]    DOA: 05/21/2016 PCP: No primary care provider on file.  Nephrology: [facility]     Patient coming from: Home  Chief Complaint: Abnormal labs  HPI: Nicholas Roach is a 65 y.o. male with a past medical history significant for ESRD on HD MWF, HTN who presents with abnormal labs.  At dialysis on Monday, the patient was told his Hgb was low and he should go to the ER (he had to visit his mother in the ER, so he didn't come until today).  He has had some increased fatigue lately.  He has noticed "dark stools" intermittently this month.  Drinks alcohol/binges, last use within the last month.  Around that time he had some hematemesis (blood mixed in with large vomit, no coffee group emesis, no large volume pure red blood emesis).  Does not use NSAIDs.  No previous history of GIB that he remembers.  Last colonoscopy a year ago at the [facility], no abnormal findings that he can remember, can't remember ever having an EGD.  No known cirrhosis.  ED course: -Afebrile, heart rate 90s, respirations and pulse ox normal, blood pressure 150/90 -Na 136, K 4.1, Cr 10.4 (BUN 47), WBC 10.2 K, Hgb 7.0 -FOBT positive, brown stool, no gross melena -He was transfused 1 unit and TRH were asked to evaluate for admission given transfusion               ROS: Review of Systems  Respiratory: Positive for hemoptysis (scant, several times over last month).   Gastrointestinal: Positive for melena and vomiting (with hematemesis once in last month). Negative for blood in stool.  Neurological: Positive for loss of consciousness (once last Fri after dialysis dry weight decreased).  All other systems reviewed and are negative.         Past Medical History:  Diagnosis Date  . CKD (chronic kidney disease) stage 3, GFR 30-59 ml/min   . Hepatitis C   . Hypertension   . Renal disorder     Past Surgical History:    Procedure Laterality Date  . FOOT FRACTURE SURGERY Left     Social History: Patient lives by himself.  The patient walks unassisted.  Used to drive a dump truck.  Smokes occasionally.  Drinks alcohol, sometimes binges, last time was within the last month.  From [place].  No Known Allergies  Family history: family history includes Heart Problems in his mother; Hypertension in his mother.  Prior to Admission medications   Medication Sig Start Date End Date Taking? Authorizing Provider  amLODipine (NORVASC) 10 MG tablet Take 10 mg by mouth daily.   Yes Historical Provider, MD  calcium acetate (PHOSLO) 667 MG capsule Take 1,334 mg by mouth 3 (three) times daily with meals.   Yes Historical Provider, MD  carvedilol (COREG) 12.5 MG tablet Take 12.5 mg by mouth 2 (two) times daily with a meal.   Yes Historical Provider, MD  doxazosin (CARDURA) 8 MG tablet Take 8 mg by mouth daily.   Yes Historical Provider, MD  diazepam (VALIUM) 5 MG tablet Take 1 tablet (5 mg total) by mouth 2 (two) times daily. Patient not taking: Reported on 05/21/2016 02/18/14   Cleatrice Burke, PA-C  oxyCODONE-acetaminophen (PERCOCET/ROXICET) 5-325 MG per tablet Take 1-2 tablets by mouth every 6 (six) hours as needed for moderate pain or severe pain. Patient not taking:  Reported on 05/21/2016 02/18/14   Cleatrice Burke, PA-C       Physical Exam: BP 152/87   Pulse 93   Temp 97.5 F (36.4 C) (Oral)   Resp 16   SpO2 98%  General appearance: Well-developed, adult male, alert and in no acut distress.  Sleepy and sometimes slow with answers. Eyes: Anicteric, conjunctiva pink, lids and lashes normal. PERRL.    ENT: No nasal deformity, discharge, epistaxis.  Hearing normal. OP moist without lesions.   Neck: No neck masses.  Trachea midline.  No thyromegaly/tenderness. Lymph: No cervical or supraclavicular lymphadenopathy. Skin: Warm and dry.  No jaundice.  No suspicious rashes or lesions. Cardiac: RRR, nl  S1-S2, no murmurs appreciated.  Capillary refill is brisk.  JVP normal.  No LE edema.  Radial and DP pulses 2+ and symmetric.No carotid bruits. Respiratory: Normal respiratory rate and rhythm.  CTAB without rales or wheezes. Abdomen: Abdomen soft.  No TTP. No ascites, distension, hepatosplenomegaly.   MSK: No deformities or effusions.  No cyanosis or clubbing. Neuro: Cranial nerves normal.  Sensation intact to light touch. Speech is fluent.  Muscle strength normal.    Psych: Sensorium intact and responding to questions, attention normal.  Behavior appropriate.  Affect normal.  Judgment and insight appear normal.     Labs on Admission:  I have personally reviewed following labs and imaging studies: CBC:  Recent Labs Lab 05/21/16 0113  WBC 10.2  NEUTROABS 7.1  HGB 7.0*  HCT 21.5*  MCV 92.7  PLT 161   Basic Metabolic Panel:  Recent Labs Lab 05/21/16 0113  NA 136  K 4.1  CL 93*  CO2 28  GLUCOSE 102*  BUN 47*  CREATININE 10.04*  CALCIUM 9.0   GFR: CrCl cannot be calculated (Unknown ideal weight.).  Liver Function Tests:  Recent Labs Lab 05/21/16 0113  AST 20  ALT 12*  ALKPHOS 66  BILITOT 0.7  PROT 6.7  ALBUMIN 2.9*   No results for input(s): LIPASE, AMYLASE in the last 168 hours. No results for input(s): AMMONIA in the last 168 hours. Coagulation Profile: No results for input(s): INR, PROTIME in the last 168 hours. Cardiac Enzymes: No results for input(s): CKTOTAL, CKMB, CKMBINDEX, TROPONINI in the last 168 hours. BNP (last 3 results) No results for input(s): PROBNP in the last 8760 hours. HbA1C: No results for input(s): HGBA1C in the last 72 hours. CBG: No results for input(s): GLUCAP in the last 168 hours. Lipid Profile: No results for input(s): CHOL, HDL, LDLCALC, TRIG, CHOLHDL, LDLDIRECT in the last 72 hours. Thyroid Function Tests: No results for input(s): TSH, T4TOTAL, FREET4, T3FREE, THYROIDAB in the last 72 hours. Anemia Panel: No results for  input(s): VITAMINB12, FOLATE, FERRITIN, TIBC, IRON, RETICCTPCT in the last 72 hours. Sepsis Labs: Invalid input(s): PROCALCITONIN, LACTICIDVEN No results found for this or any previous visit (from the past 240 hour(s)).       Radiological Exams on Admission: Personally reviewed CXR from 4 days ago shows bilateral opacities, consistent with edema, favored over hemorrhage.              Assessment/Plan  1. Anemia, presumed GIB:  Patient with underlying anemia of renal disease, now with history of ?melena and FOBT+ but without gross melena on exam.  No previous GIB.  +alcohol use.  No NSAIDs.  Suspect this is a slow, chronic GI bleed, unclear source.    -Transfuse 1 unit now -Check Hgb post transfusion  -PPI bolus dosing -NPO -Consult to GI, appreciate  cares  -Check iron stores -Check reticulocytes     2. ESRD on HD:  -Consult to Nephrology for maintenance HD  3. HTN:  -Continue amlodipine, carvedilol, doxazosin  4. Abnormal CXR and hemoptysis:  Bilateral opacities on CXR four days ago, edema vs hemorrhage.  CT of chest with contrast is recommended to rule out underlying mass given history of smoking and hemoptysis.          DVT prophylaxis: SCDs  Code Status: FULL  Family Communication: None present  Disposition Plan: Anticipate transfuse one unit of blood, check post-transfusion H/H.  Will discuss with GI while here about in hospital versus outpatient endoscopy. Consults called: None overnight Admission status: OBS        Medical decision making: Patient seen at 3:45 AM on 05/21/2016.  The patient was discussed with Dr. Claudine Mouton.  What exists of the patient's chart was reviewed in depth and summarized above.  Clinical condition: stable.        Edwin Dada [facility] Pager [phone]

## 2016-05-21 NOTE — ED Notes (Signed)
Transported to [facility] in stable condition .

## 2016-05-21 NOTE — Progress Notes (Signed)
Patient ID: Nicholas Roach, male   DOB: 08-30-1950, 65 y.o.   MRN: [id number] Neophology:         Please let us know if is changed to inpatient status from observation.  Otherwise d/c to outpatient dialysis.  We will not see unless contacted for above change.

## 2016-05-21 NOTE — Progress Notes (Addendum)
Progress note Patient seen and examined. He states that he has had dark, black stool for a couple of months. Last colonoscopy was a year ago at [facility] [place] which was normal. He also admits to coughing up bright red blood for a couple of months. He did not think much of these symptoms. Last Friday, he had a dizzy spell where he "blacked out" but did not fall as he was able to grab on to a side of a truck. Then Monday, he was evaluated at dialysis and was told his hemoglobin was low. I discussed with him my plans today for GI consult, dialysis during hospitalization, and CT chest to evaluate for his hemoptysis with his 40-year smoking history. He stated that he wants to leave AMA as he is hungry and wants to go to [place] [facility] in [place] instead. I did discuss with him the benefit of staying including further work up and stabilization and risk of leaving including deterioration and death.   Dessa Phi, DO 06-13-16, 11:00 AM    Addendum Patient has changed his mind and would like to stay for further work up and treatment. Will consult GI for melena, obtain CT chest to further evaluate interstitial lung findings with hx of hemoptysis, and consult Nephrology for dialysis after Ct.   It is my clinical opinion that admission to INPATIENT is reasonable and necessary in this 65 y.o. year old male   presenting with symptoms of melena, hemoptysis, concerning for symptomatic anemia.   in the context of PMH including: ESRD, hepatitis C, HTN, tobacco abuse   with pertinent positives on radiographic and laboratory data including: Hgb 7.0 and positive FOBT   Workup and treatment include CT chest with contrast and Nephrology, GI consult.  Given the aforementioned, the predictability of an adverse outcome is felt to be significant. I expect that the patient will require at least 2 midnights in the hospital to treat this condition.  Dessa Phi, DO [facility] [email or web address] Password [email or web address] 06-13-2016, 11:19 AM

## 2016-05-21 NOTE — Progress Notes (Signed)
Patient MRSA swab came back positive this morning.Patient placed on contact isolation and educated patient.

## 2016-05-21 NOTE — ED Provider Notes (Signed)
[facility] DEPT Provider Note   CSN: [id number] Arrival date & time: 05/21/16  0947  By signing my name below, I, Reola Mosher, attest that this documentation has been prepared under the direction and in the presence of Everlene Balls, MD. Electronically Signed: Reola Mosher, ED Scribe. 05/21/16. 3:19 AM.  History   Chief Complaint Chief Complaint  Patient presents with  . Needs Blood Transfusion   The history is provided by the patient and medical records. No language interpreter was used.   HPI Comments: Nicholas Roach is a 65 y.o. male with a PMHx of CKD stage III and HTN, who presents to the Emergency Department for a blood transfusion. Pt states that he was seen for his dialysis appointment 2 days ago, and at that time they noticed that his HGB level was 6.5. He reports that received this news yesterday and was instructed to come into the ED at that time, however, waited until today to come in for this issue. He reports that prior to this he has had diffuse bodily weakness and fatigue over the past week. Pt has had one prior blood transfusion in the past which he notes was approximately 8 months ago, however he is unsure of any underlying factors to precipitate this issue. No recent illness/infections. Denies SOB, bloody stools, or any other associated symptoms.   Past Medical History:  Diagnosis Date  . CKD (chronic kidney disease) stage 3, GFR 30-59 ml/min   . Hepatitis C   . Hypertension   . Renal disorder    There are no active problems to display for this patient.  Past Surgical History:  Procedure Laterality Date  . FOOT FRACTURE SURGERY Left     Home Medications    Prior to Admission medications   Medication Sig Start Date End Date Taking? Authorizing Provider  amLODipine (NORVASC) 10 MG tablet Take 10 mg by mouth daily.   Yes Historical Provider, MD  calcium acetate (PHOSLO) 667 MG capsule Take 1,334 mg by mouth 3 (three) times daily with meals.    Yes Historical Provider, MD  carvedilol (COREG) 12.5 MG tablet Take 12.5 mg by mouth 2 (two) times daily with a meal.   Yes Historical Provider, MD  doxazosin (CARDURA) 8 MG tablet Take 8 mg by mouth daily.   Yes Historical Provider, MD  diazepam (VALIUM) 5 MG tablet Take 1 tablet (5 mg total) by mouth 2 (two) times daily. Patient not taking: Reported on 05/21/2016 02/18/14   Cleatrice Burke, PA-C  oxyCODONE-acetaminophen (PERCOCET/ROXICET) 5-325 MG per tablet Take 1-2 tablets by mouth every 6 (six) hours as needed for moderate pain or severe pain. Patient not taking: Reported on 05/21/2016 02/18/14   Cleatrice Burke, PA-C   Family History No family history on file.  Social History Social History  Substance Use Topics  . Smoking status: Current Every Day Smoker  . Smokeless tobacco: Not on file  . Alcohol use Yes     Comment: Occasionally   Allergies   Review of patient's allergies indicates no known allergies.  Review of Systems Review of Systems A complete 10 system review of systems was obtained and all systems are negative except as noted in the HPI and PMH.   Physical Exam Updated Vital Signs BP (!) 157/103   Pulse 90   Temp 97.6 F (36.4 C) (Oral)   Resp 15   SpO2 96%   Physical Exam  Constitutional: He is oriented to person, place, and time. Vital signs are normal.  He appears well-developed and well-nourished.  Non-toxic appearance. He does not appear ill. No distress.  HENT:  Head: Normocephalic and atraumatic.  Nose: Nose normal.  Mouth/Throat: Oropharynx is clear and moist. No oropharyngeal exudate.  Eyes: Conjunctivae and EOM are normal. Pupils are equal, round, and reactive to light. No scleral icterus.  Neck: Normal range of motion. Neck supple. No tracheal deviation, no edema, no erythema and normal range of motion present. No thyroid mass and no thyromegaly present.  Cardiovascular: Normal rate, regular rhythm, S1 normal, S2 normal, normal heart sounds, intact  distal pulses and normal pulses.  Exam reveals no gallop and no friction rub.   No murmur heard. Pulmonary/Chest: Effort normal and breath sounds normal. No respiratory distress. He has no wheezes. He has no rhonchi. He has no rales.  Abdominal: Soft. Normal appearance and bowel sounds are normal. He exhibits no distension, no ascites and no mass. There is no hepatosplenomegaly. There is no tenderness. There is no rebound, no guarding and no CVA tenderness.  Genitourinary: Rectal exam shows guaiac positive stool.  Musculoskeletal: Normal range of motion. He exhibits no edema or tenderness.  RUE AV fistula w/ palpable thrill.   Lymphadenopathy:    He has no cervical adenopathy.  Neurological: He is alert and oriented to person, place, and time. He has normal strength. No cranial nerve deficit or sensory deficit.  Skin: Skin is warm, dry and intact. No petechiae and no rash noted. He is not diaphoretic. No erythema. No pallor.  Nursing note and vitals reviewed.  ED Treatments / Results  DIAGNOSTIC STUDIES: Oxygen Saturation is 97% on RA, normal by my interpretation.   COORDINATION OF CARE: 3:05 AM-Discussed next steps with pt. Pt verbalized understanding and is agreeable with the plan.   Labs (all labs ordered are listed, but only abnormal results are displayed) Labs Reviewed  CBC WITH DIFFERENTIAL/PLATELET - Abnormal; Notable for the following:       Result Value   RBC 2.32 (*)    Hemoglobin 7.0 (*)    HCT 21.5 (*)    RDW 20.9 (*)    All other components within normal limits  COMPREHENSIVE METABOLIC PANEL - Abnormal; Notable for the following:    Chloride 93 (*)    Glucose, Bld 102 (*)    BUN 47 (*)    Creatinine, Ser 10.04 (*)    Albumin 2.9 (*)    ALT 12 (*)    GFR calc non Af Amer 5 (*)    GFR calc Af Amer 6 (*)    All other components within normal limits  TYPE AND SCREEN  ABO/RH   EKG  EKG Interpretation None      Radiology No results  found.  Procedures Procedures   Medications Ordered in ED Medications - No data to display  Initial Impression / Assessment and Plan / ED Course  I have reviewed the triage vital signs and the nursing notes.  Pertinent labs & imaging results that were available during my care of the patient were reviewed by me and considered in my medical decision making (see chart for details).  Clinical Course   Patient presents to the ED for symptomatic anemia.  He was told to come to the ED 2 days ago but waited so he could visit his mother in the hospital.  Hgb today is 7.0 and hemoccult is positive.  2 units of blood have been ordered.  Will page hospitalist for admission.    3:53 AM Dr. Loleta Books  accepts for further care. CRITICAL CARE Performed by: Everlene Balls   Total critical care time: 40 minutes - symptomatic anemia requiring blood transfusion  Critical care time was exclusive of separately billable procedures and treating other patients.  Critical care was necessary to treat or prevent imminent or life-threatening deterioration.  Critical care was time spent personally by me on the following activities: development of treatment plan with patient and/or surrogate as well as nursing, discussions with consultants, evaluation of patient's response to treatment, examination of patient, obtaining history from patient or surrogate, ordering and performing treatments and interventions, ordering and review of laboratory studies, ordering and review of radiographic studies, pulse oximetry and re-evaluation of patient's condition.   Final Clinical Impressions(s) / ED Diagnoses   Final diagnoses:  None   New Prescriptions New Prescriptions   No medications on file   I personally performed the services described in this documentation, which was scribed in my presence. The recorded information has been reviewed and is accurate.      Everlene Balls, MD 05/21/16 [phone]

## 2016-05-21 NOTE — Progress Notes (Signed)
Patient arrived to unit [facility] bed 13 from emergency room. Patient assisted to bed by nursing staff.Patient has blood infusing at 200 cc/hr via peripheral IV . Denies pain or shortness of breath.Oriented patient to nursing unit and call bell.No acute distress noted at present time will continue to monitor.

## 2016-05-21 NOTE — ED Notes (Signed)
1ST unit PRBC infusing at 200 ml/hr , iv sites intact , respirations unlabored , no signs of adverse reaction .

## 2016-05-21 NOTE — Progress Notes (Signed)
Patient verbalized that he wanted to leave becouse M.D are not around and beside he has papers needs to be working on.Spoke with M.D on patient's intention to leave.

## 2016-05-21 NOTE — ED Notes (Addendum)
Pt. signed consent for blood transfusion . Dr. Claudine Mouton explained plan of care and admission to pt.

## 2016-05-21 NOTE — ED Triage Notes (Signed)
Pt. reports low HGB = 6.5 , advised to go to hospital for blood transfusion , hemodialysis q Mon/Wed/ Fri. , asymptomatic at triage .

## 2016-05-21 NOTE — Assessment & Plan Note (Signed)
States he was tx with Harvoni last year at [facility]

## 2016-05-21 NOTE — Consult Note (Signed)
Reason for Consult ESRD Referring Physician: Dr. Clint Guy is an 65 y.o. male.  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  Apparently told to go to hosp last week but did not.  Hx hematemesis about 1-2 wk ago and ongoing black stools.  Has bronchogenic Ca and is having hemoptysis ongoing also.  Vomits 4-5 x /wk, no stom pain and good appetite.  Coughs up blood multiple times/d. Denies SOB, or CP. Admitted for Hb of 7.   On 225 mcg Micera at [facility].  No D, or SOB.    Hx hep C. Constitutional: negative, good appetite and feels ok Eyes: wears glasses Ears, nose, mouth, throat, and face: negative Respiratory: as above Cardiovascular: negative Gastrointestinal: as  above Genitourinary:negative Integument/breast: negative Hematologic/lymphatic: anemia Musculoskeletal:negative Neurological: negative Endocrine: negative Allergic/Immunologic: negative   Dialyzes at [place]  since 2.17 . Primary Nephrologist  [facility]. . , Dialyzer 180 NR, Heparin none. Access LLA avf.  Past Medical History:  Diagnosis Date  . CKD (chronic kidney disease) stage 3, GFR 30-59 ml/min   . Hepatitis C   . Hypertension   . Renal disorder   . Renal insufficiency     Past Surgical History:  Procedure Laterality Date  . FOOT FRACTURE SURGERY Left     Family History  Problem Relation Age of Onset  . Hypertension Mother   . Heart Problems Mother     Social History:  reports that he has been smoking.  He has never used smokeless tobacco. He reports that he drinks alcohol. He reports that he uses drugs, including Cocaine.  Allergies: No Known Allergies  Medications:  I have reviewed the patient's current medications. Prior to Admission:  No prescriptions prior to admission.   , Calcitriol .78mg . Micera 225 mcg iv q 2 wk   Results for orders placed or performed during the hospital encounter of 05/21/16 (from the past 48 hour(s))  CBC with  Differential     Status: Abnormal   Collection Time: 05/21/16  1:13 AM  Result Value Ref Range   WBC 10.2 4.0 - 10.5 K/uL   RBC 2.32 (L) 4.22 - 5.81 MIL/uL   Hemoglobin 7.0 (L) 13.0 - 17.0 g/dL   HCT 21.5 (L) 39.0 - 52.0 %   MCV 92.7 78.0 - 100.0 fL   MCH 30.2 26.0 - 34.0 pg   MCHC 32.6 30.0 - 36.0 g/dL   RDW 20.9 (H) 11.5 - 15.5 %   Platelets 209 150 - 400 K/uL   Neutrophils Relative % 70 %   Neutro Abs 7.1 1.7 - 7.7 K/uL   Lymphocytes Relative 17 %   Lymphs Abs 1.8 0.7 - 4.0 K/uL   Monocytes Relative 9 %   Monocytes Absolute 0.9 0.1 - 1.0 K/uL   Eosinophils Relative 3 %   Eosinophils Absolute 0.3 0.0 - 0.7 K/uL   Basophils Relative 1 %   Basophils Absolute 0.1 0.0 - 0.1 K/uL  Comprehensive metabolic panel     Status: Abnormal   Collection Time: 05/21/16  1:13 AM  Result Value Ref Range   Sodium 136 135 - 145 mmol/L   Potassium 4.1 3.5 - 5.1 mmol/L   Chloride 93 (L) 101 - 111 mmol/L   CO2 28 22 - 32 mmol/L   Glucose, Bld 102 (H) 65 - 99 mg/dL   BUN 47 (H) 6 - 20 mg/dL   Creatinine, Ser 10.04 (H)  0.61 - 1.24 mg/dL   Calcium 9.0 8.9 - 10.3 mg/dL   Total Protein 6.7 6.5 - 8.1 g/dL   Albumin 2.9 (L) 3.5 - 5.0 g/dL   AST 20 15 - 41 U/L   ALT 12 (L) 17 - 63 U/L   Alkaline Phosphatase 66 38 - 126 U/L   Total Bilirubin 0.7 0.3 - 1.2 mg/dL   GFR calc non Af Amer 5 (L) >60 mL/min   GFR calc Af Amer 6 (L) >60 mL/min    Comment: (NOTE) The eGFR has been calculated using the CKD EPI equation. This calculation has not been validated in all clinical situations. eGFR's persistently <60 mL/min signify possible Chronic Kidney Disease.    Anion gap 15 5 - 15  Type and screen [place]     Status: None (Preliminary result)   Collection Time: 05/21/16  1:15 AM  Result Value Ref Range   ABO/RH(D) O POS    Antibody Screen NEG    Sample Expiration 05/24/2016    Unit Number [id number]    Blood Component Type RED CELLS,LR    Unit division 00    Status of Unit  ISSUED    Transfusion Status OK TO TRANSFUSE    Crossmatch Result Compatible    Unit Number [id number]    Blood Component Type RED CELLS,LR    Unit division 00    Status of Unit ALLOCATED    Transfusion Status OK TO TRANSFUSE    Crossmatch Result Compatible   ABO/Rh     Status: None   Collection Time: 05/21/16  1:15 AM  Result Value Ref Range   ABO/RH(D) O POS   Prepare RBC     Status: None   Collection Time: 05/21/16  3:16 AM  Result Value Ref Range   Order Confirmation ORDER PROCESSED BY BLOOD BANK   POC occult blood, ED RN will collect     Status: Abnormal   Collection Time: 05/21/16  3:30 AM  Result Value Ref Range   Fecal Occult Bld POSITIVE (A) NEGATIVE  MRSA PCR Screening     Status: Abnormal   Collection Time: 05/21/16  5:07 AM  Result Value Ref Range   MRSA by PCR POSITIVE (A) NEGATIVE    Comment:        The GeneXpert MRSA Assay (FDA approved for NASAL specimens only), is one component of a comprehensive MRSA colonization surveillance program. It is not intended to diagnose MRSA infection nor to guide or monitor treatment for MRSA infections. RESULT CALLED TO, READ BACK BY AND VERIFIED WITH: A LOWE,RN @0656  05/21/16 MKELLY,MLT   Ferritin     Status: Abnormal   Collection Time: 05/21/16  8:40 AM  Result Value Ref Range   Ferritin 337 (H) 24 - 336 ng/mL  Iron and TIBC     Status: Abnormal   Collection Time: 05/21/16  8:40 AM  Result Value Ref Range   Iron 44 (L) 45 - 182 ug/dL   TIBC 217 (L) 250 - 450 ug/dL   Saturation Ratios 20 17.9 - 39.5 %   UIBC 173 ug/dL  Reticulocytes     Status: Abnormal   Collection Time: 05/21/16  8:40 AM  Result Value Ref Range   Retic Ct Pct 8.0 (H) 0.4 - 3.1 %   RBC. 2.51 (L) 4.22 - 5.81 MIL/uL   Retic Count, Manual 200.8 (H) 19.0 - 186.0 K/uL  CBC     Status: Abnormal   Collection Time: 05/21/16  8:40 AM  Result Value Ref Range   WBC 10.8 (H) 4.0 - 10.5 K/uL   RBC 2.51 (L) 4.22 - 5.81 MIL/uL   Hemoglobin 7.6 (L) 13.0  - 17.0 g/dL   HCT 23.0 (L) 39.0 - 52.0 %   MCV 91.6 78.0 - 100.0 fL   MCH 30.3 26.0 - 34.0 pg   MCHC 33.0 30.0 - 36.0 g/dL   RDW 20.4 (H) 11.5 - 15.5 %   Platelets 185 150 - 400 K/uL    Ct Chest W Contrast  Result Date: 05/21/2016 CLINICAL DATA:  Hemoptysis for several months, occasional chest pain and shortness of breath, patient vague about history, history of hypertension, hepatitis-C, end-stage renal disease on dialysis EXAM: CT CHEST WITH CONTRAST TECHNIQUE: Multidetector CT imaging of the chest was performed during intravenous contrast administration. Sagittal and coronal MPR images reconstructed from axial data set. CONTRAST:  63m ISOVUE-300 IOPAMIDOL (ISOVUE-300) INJECTION 61% IV COMPARISON:  Chest radiograph 05/16/2016 FINDINGS: Cardiovascular: Atherosclerotic calcifications aorta and coronary arteries. Mitral annular calcification. Aorta normal caliber. No pericardial effusion. Mediastinum/Nodes: Few scattered upper normal size thoracic nodes. Mildly enlarged RIGHT hilar node 16 mm short axis image 62. Normal sized axillary nodes. Base of cervical region unremarkable. Scattered soft tissue edema. No gross esophageal abnormalities. Calcified subcarinal node. Lungs/Pleura: BILATERAL pleural effusions RIGHT greater than LEFT. Patchy airspace infiltrates in both lower lobes and at scattered foci throughout remaining lungs. Calcified granulomata in RIGHT upper lobe. Questionable sub solid nodule versus focus of infiltrate RIGHT middle lobe image 72. No dominant pulmonary mass or pneumothorax. Mild central peribronchial thickening and scattered thickening of interlobular septa. Upper Abdomen: Beam hardening artifacts in upper abdomen from patient's arms. Cortical atrophy at the upper poles of the kidneys with a 8 mildly complicated cystic lesion at upper to mid LEFT kidney 2.2 x 1.8 cm containing foci of calcification within the wall. Musculoskeletal: Chronic appearing height loss of a lower  thoracic vertebra, approximately T11. No acute osseous findings. IMPRESSION: BILATERAL pulmonary infiltrates which may represent pneumonia though could also be seen with pulmonary hemorrhage and with alveolar edema. BILATERAL pleural effusions RIGHT greater than LEFT. Upper normal sized mediastinal nodes with single enlarged RIGHT hilar node, nonspecific in the setting of potential acute infiltrates. Old granulomatous disease. Mildly complicated cystic lesion at upper to mid LEFT kidney 2.2 cm greatest size with foci of calcification within wall, stability uncertain in the absence of prior exams. Aortic atherosclerosis and coronary arterial calcification. Electronically Signed   By: MLavonia DanaM.D.   On: 05/21/2016 13:06    ROS Blood pressure (!) 167/90, pulse (!) 103, temperature 98.5 F (36.9 C), temperature source Oral, resp. rate 18, height 5' 9"  (1.753 m), weight 71 kg (156 lb 8.4 oz), SpO2 96 %. Physical Exam Physical Examination: General appearance - alert, well appearing, and in no distress and angry about NPO Mental status - alert, oriented to person, place, and time, agitated Eyes - pupils equal and reactive, extraocular eye movements intact, funduscopic exam abnormal HTN retinopathy Mouth - mucous membranes moist, pharynx normal without lesions Neck - adenopathy noted PCL Lymphatics - no palpable lymphadenopathy, no hepatosplenomegaly, posterior cervical nodes Chest - decrease bs,both lower lung field. R>L.  Rhonchi in bases. Rales in bases Heart - S1 and S2 normal, S4 present, systolic murmur GOF7/5at apex Abdomen - pos bs,liver down 5 cm Extremities - pedal edema 1 +, AVF LLA b&t Skin - normal coloration and turgor, no rashes, no suspicious skin lesions noted  Assessment/Plan: 1 Blood in  stool not clear is GI but has low Hb. And needs eval.  Strange hx V,  Needs GI eval.  Low Hb 2 ESRD: needs HD , vol xs 3 Hypertension: needs less med lower vol 4. Anemia of ESRD: max esa, give  Fe 5. Metabolic Bone Disease: cont meds 6 Bronchogenic Ca P  HD, lower vol, esa,  IV Fe, GI eval,   Jhane Lorio L 05/21/2016, 1:25 PM

## 2016-05-21 NOTE — Progress Notes (Signed)
Nurse went into the patient's room to have him signe the A.M.A. Form,patient said that he changed his mind and he is going to stay as per advised by his sister.M.D. on call made aware of patient is staying here.

## 2016-05-25 LAB — TYPE AND SCREEN
ABO/RH(D): O POS
Antibody Screen: NEGATIVE
UNIT DIVISION: 0
Unit division: 0

## 2016-06-05 ENCOUNTER — Institutional Professional Consult (permissible substitution): Payer: Non-veteran care | Admitting: Internal Medicine

## 2016-07-03 ENCOUNTER — Inpatient Hospital Stay (HOSPITAL_COMMUNITY)
Admission: EM | Admit: 2016-07-03 | Discharge: 2016-07-05 | DRG: 189 | Disposition: A | Discharge status: Home / Self Care | Payer: Non-veteran care | Attending: Internal Medicine | Admitting: Internal Medicine

## 2016-07-03 ENCOUNTER — Emergency Department (HOSPITAL_COMMUNITY): Payer: Non-veteran care

## 2016-07-03 ENCOUNTER — Encounter (HOSPITAL_COMMUNITY): Payer: Self-pay | Admitting: *Deleted

## 2016-07-03 DIAGNOSIS — R042 Hemoptysis: Secondary | ICD-10-CM | POA: Diagnosis present

## 2016-07-03 DIAGNOSIS — Z9115 Patient's noncompliance with renal dialysis: Secondary | ICD-10-CM

## 2016-07-03 DIAGNOSIS — C3490 Malignant neoplasm of unspecified part of unspecified bronchus or lung: Secondary | ICD-10-CM | POA: Diagnosis present

## 2016-07-03 DIAGNOSIS — E8770 Fluid overload, unspecified: Secondary | ICD-10-CM | POA: Diagnosis present

## 2016-07-03 DIAGNOSIS — B1920 Unspecified viral hepatitis C without hepatic coma: Secondary | ICD-10-CM | POA: Diagnosis present

## 2016-07-03 DIAGNOSIS — M5416 Radiculopathy, lumbar region: Secondary | ICD-10-CM | POA: Diagnosis not present

## 2016-07-03 DIAGNOSIS — Z590 Homelessness: Secondary | ICD-10-CM | POA: Diagnosis not present

## 2016-07-03 DIAGNOSIS — D631 Anemia in chronic kidney disease: Secondary | ICD-10-CM | POA: Diagnosis present

## 2016-07-03 DIAGNOSIS — N186 End stage renal disease: Secondary | ICD-10-CM | POA: Diagnosis present

## 2016-07-03 DIAGNOSIS — F1010 Alcohol abuse, uncomplicated: Secondary | ICD-10-CM | POA: Diagnosis present

## 2016-07-03 DIAGNOSIS — M898X9 Other specified disorders of bone, unspecified site: Secondary | ICD-10-CM | POA: Diagnosis present

## 2016-07-03 DIAGNOSIS — N2581 Secondary hyperparathyroidism of renal origin: Secondary | ICD-10-CM | POA: Diagnosis present

## 2016-07-03 DIAGNOSIS — D649 Anemia, unspecified: Secondary | ICD-10-CM | POA: Diagnosis present

## 2016-07-03 DIAGNOSIS — Z79899 Other long term (current) drug therapy: Secondary | ICD-10-CM

## 2016-07-03 DIAGNOSIS — Z992 Dependence on renal dialysis: Secondary | ICD-10-CM

## 2016-07-03 DIAGNOSIS — F17200 Nicotine dependence, unspecified, uncomplicated: Secondary | ICD-10-CM | POA: Diagnosis not present

## 2016-07-03 DIAGNOSIS — F1410 Cocaine abuse, uncomplicated: Secondary | ICD-10-CM | POA: Diagnosis present

## 2016-07-03 DIAGNOSIS — M545 Low back pain: Secondary | ICD-10-CM | POA: Diagnosis present

## 2016-07-03 DIAGNOSIS — Z9119 Patient's noncompliance with other medical treatment and regimen: Secondary | ICD-10-CM | POA: Diagnosis not present

## 2016-07-03 DIAGNOSIS — I150 Renovascular hypertension: Secondary | ICD-10-CM | POA: Diagnosis present

## 2016-07-03 DIAGNOSIS — R0902 Hypoxemia: Secondary | ICD-10-CM | POA: Diagnosis present

## 2016-07-03 DIAGNOSIS — R059 Cough, unspecified: Secondary | ICD-10-CM

## 2016-07-03 DIAGNOSIS — J811 Chronic pulmonary edema: Secondary | ICD-10-CM | POA: Insufficient documentation

## 2016-07-03 DIAGNOSIS — E875 Hyperkalemia: Secondary | ICD-10-CM | POA: Diagnosis present

## 2016-07-03 DIAGNOSIS — I120 Hypertensive chronic kidney disease with stage 5 chronic kidney disease or end stage renal disease: Secondary | ICD-10-CM | POA: Diagnosis present

## 2016-07-03 DIAGNOSIS — J810 Acute pulmonary edema: Secondary | ICD-10-CM | POA: Diagnosis present

## 2016-07-03 DIAGNOSIS — R05 Cough: Secondary | ICD-10-CM | POA: Diagnosis present

## 2016-07-03 LAB — CBC WITH DIFFERENTIAL/PLATELET
Basophils Absolute: 0 10*3/uL (ref 0.0–0.1)
Basophils Relative: 0 %
EOS ABS: 0.2 10*3/uL (ref 0.0–0.7)
Eosinophils Relative: 2 %
HCT: 21.7 % — ABNORMAL LOW (ref 39.0–52.0)
HEMOGLOBIN: 7.7 g/dL — AB (ref 13.0–17.0)
LYMPHS ABS: 0.7 10*3/uL (ref 0.7–4.0)
LYMPHS PCT: 7 %
MCH: 31 pg (ref 26.0–34.0)
MCHC: 35.5 g/dL (ref 30.0–36.0)
MCV: 87.5 fL (ref 78.0–100.0)
MONOS PCT: 7 %
Monocytes Absolute: 0.6 10*3/uL (ref 0.1–1.0)
NEUTROS PCT: 84 %
Neutro Abs: 7.8 10*3/uL — ABNORMAL HIGH (ref 1.7–7.7)
Platelets: 137 10*3/uL — ABNORMAL LOW (ref 150–400)
RBC: 2.48 MIL/uL — AB (ref 4.22–5.81)
RDW: 16.9 % — ABNORMAL HIGH (ref 11.5–15.5)
WBC: 9.2 10*3/uL (ref 4.0–10.5)

## 2016-07-03 LAB — CBC
HCT: 21.8 % — ABNORMAL LOW (ref 39.0–52.0)
Hemoglobin: 7.4 g/dL — ABNORMAL LOW (ref 13.0–17.0)
MCH: 29.6 pg (ref 26.0–34.0)
MCHC: 33.9 g/dL (ref 30.0–36.0)
MCV: 87.2 fL (ref 78.0–100.0)
PLATELETS: 164 10*3/uL (ref 150–400)
RBC: 2.5 MIL/uL — AB (ref 4.22–5.81)
RDW: 17.1 % — ABNORMAL HIGH (ref 11.5–15.5)
WBC: 9.2 10*3/uL (ref 4.0–10.5)

## 2016-07-03 LAB — I-STAT CHEM 8, ED
BUN: 111 mg/dL — AB (ref 6–20)
CALCIUM ION: 1.15 mmol/L (ref 1.15–1.40)
CHLORIDE: 100 mmol/L — AB (ref 101–111)
GLUCOSE: 102 mg/dL — AB (ref 65–99)
HCT: 22 % — ABNORMAL LOW (ref 39.0–52.0)
Hemoglobin: 7.5 g/dL — ABNORMAL LOW (ref 13.0–17.0)
Potassium: 5.1 mmol/L (ref 3.5–5.1)
SODIUM: 138 mmol/L (ref 135–145)
TCO2: 22 mmol/L (ref 0–100)

## 2016-07-03 LAB — I-STAT TROPONIN, ED: Troponin i, poc: 0.09 ng/mL (ref 0.00–0.08)

## 2016-07-03 LAB — RETICULOCYTES
RBC.: 2.5 MIL/uL — AB (ref 4.22–5.81)
RETIC COUNT ABSOLUTE: 27.5 10*3/uL (ref 19.0–186.0)
RETIC CT PCT: 1.1 % (ref 0.4–3.1)

## 2016-07-03 LAB — RENAL FUNCTION PANEL
Albumin: 3.4 g/dL — ABNORMAL LOW (ref 3.5–5.0)
Anion gap: 19 — ABNORMAL HIGH (ref 5–15)
BUN: 113 mg/dL — ABNORMAL HIGH (ref 6–20)
CO2: 20 mmol/L — ABNORMAL LOW (ref 22–32)
Calcium: 9 mg/dL (ref 8.9–10.3)
Chloride: 99 mmol/L — ABNORMAL LOW (ref 101–111)
Creatinine, Ser: 19.59 mg/dL — ABNORMAL HIGH (ref 0.61–1.24)
GFR calc Af Amer: 2 mL/min — ABNORMAL LOW (ref >60)
GFR calc non Af Amer: 2 mL/min — ABNORMAL LOW (ref >60)
Glucose, Bld: 107 mg/dL — ABNORMAL HIGH (ref 65–99)
Phosphorus: 10.3 mg/dL — ABNORMAL HIGH (ref 2.5–4.6)
Potassium: 5.1 mmol/L (ref 3.5–5.1)
Sodium: 138 mmol/L (ref 135–145)

## 2016-07-03 LAB — PREPARE RBC (CROSSMATCH)

## 2016-07-03 LAB — FERRITIN: Ferritin: 305 ng/mL (ref 24–336)

## 2016-07-03 LAB — BRAIN NATRIURETIC PEPTIDE: B Natriuretic Peptide: 1731.2 pg/mL — ABNORMAL HIGH (ref 0.0–100.0)

## 2016-07-03 LAB — IRON AND TIBC
Iron: 40 ug/dL — ABNORMAL LOW (ref 45–182)
Saturation Ratios: 20 % (ref 17.9–39.5)
TIBC: 196 ug/dL — ABNORMAL LOW (ref 250–450)
UIBC: 156 ug/dL

## 2016-07-03 LAB — VITAMIN B12: VITAMIN B 12: 507 pg/mL (ref 180–914)

## 2016-07-03 LAB — FOLATE: FOLATE: 25 ng/mL (ref >5.9)

## 2016-07-03 MED ORDER — ACETAMINOPHEN 325 MG PO TABS
650.0000 mg | ORAL_TABLET | Freq: Four times a day (QID) | ORAL | Status: DC | PRN
  Administered 2016-07-03: 650 mg via ORAL

## 2016-07-03 MED ORDER — SODIUM CHLORIDE 0.9% FLUSH
3.0000 mL | Freq: Two times a day (BID) | INTRAVENOUS | Status: DC
  Administered 2016-07-04 – 2016-07-05 (×2): 3 mL via INTRAVENOUS

## 2016-07-03 MED ORDER — LABETALOL HCL 5 MG/ML IV SOLN: 10.0000 mg | INTRAVENOUS | Status: DC | PRN

## 2016-07-03 MED ORDER — SODIUM CHLORIDE 0.9 % IV SOLN: Freq: Once | INTRAVENOUS | Status: DC

## 2016-07-03 MED ORDER — LORAZEPAM 2 MG/ML IJ SOLN: 1.0000 mg | Freq: Four times a day (QID) | INTRAMUSCULAR | Status: DC | PRN

## 2016-07-03 MED ORDER — VITAMIN B-1 100 MG PO TABS
100.0000 mg | ORAL_TABLET | Freq: Every day | ORAL | Status: DC
  Administered 2016-07-03 – 2016-07-05 (×4): 100 mg via ORAL
  Filled 2016-07-03 (×4): qty 1

## 2016-07-03 MED ORDER — CARVEDILOL 12.5 MG PO TABS
12.5000 mg | ORAL_TABLET | Freq: Once | ORAL | Status: AC
  Administered 2016-07-03: 12.5 mg via ORAL
  Filled 2016-07-03: qty 1

## 2016-07-03 MED ORDER — CARVEDILOL 12.5 MG PO TABS
12.5000 mg | ORAL_TABLET | Freq: Two times a day (BID) | ORAL | Status: DC
  Administered 2016-07-03 – 2016-07-05 (×4): 12.5 mg via ORAL
  Filled 2016-07-03 (×4): qty 1

## 2016-07-03 MED ORDER — PENTAFLUOROPROP-TETRAFLUOROETH EX AERO: 1.0000 "application " | INHALATION_SPRAY | CUTANEOUS | Status: DC | PRN

## 2016-07-03 MED ORDER — ONDANSETRON HCL 4 MG/2ML IJ SOLN: 4.0000 mg | Freq: Four times a day (QID) | INTRAMUSCULAR | Status: DC | PRN

## 2016-07-03 MED ORDER — LIDOCAINE-PRILOCAINE 2.5-2.5 % EX CREA: 1.0000 "application " | TOPICAL_CREAM | CUTANEOUS | Status: DC | PRN

## 2016-07-03 MED ORDER — FUROSEMIDE 10 MG/ML IJ SOLN
120.0000 mg | INTRAVENOUS | Status: AC
  Administered 2016-07-03: 120 mg via INTRAVENOUS
  Filled 2016-07-03: qty 10

## 2016-07-03 MED ORDER — DOXAZOSIN MESYLATE 2 MG PO TABS
8.0000 mg | ORAL_TABLET | Freq: Every day | ORAL | Status: DC
  Administered 2016-07-03 – 2016-07-05 (×3): 8 mg via ORAL
  Filled 2016-07-03 (×3): qty 4

## 2016-07-03 MED ORDER — THIAMINE HCL 100 MG/ML IJ SOLN: 100.0000 mg | Freq: Every day | INTRAMUSCULAR | Status: DC

## 2016-07-03 MED ORDER — LORAZEPAM 1 MG PO TABS
1.0000 mg | ORAL_TABLET | Freq: Four times a day (QID) | ORAL | Status: DC | PRN
  Administered 2016-07-03: 1 mg via ORAL
  Filled 2016-07-03: qty 1

## 2016-07-03 MED ORDER — CALCIUM ACETATE (PHOS BINDER) 667 MG PO CAPS
1334.0000 mg | ORAL_CAPSULE | Freq: Three times a day (TID) | ORAL | Status: DC
  Administered 2016-07-04 – 2016-07-05 (×4): 1334 mg via ORAL
  Filled 2016-07-03 (×4): qty 2

## 2016-07-03 MED ORDER — DARBEPOETIN ALFA 200 MCG/0.4ML IJ SOSY: 200.0000 ug | PREFILLED_SYRINGE | INTRAMUSCULAR | Status: DC

## 2016-07-03 MED ORDER — ADULT MULTIVITAMIN W/MINERALS CH
1.0000 | ORAL_TABLET | Freq: Every day | ORAL | Status: DC
  Administered 2016-07-03 – 2016-07-05 (×3): 1 via ORAL
  Filled 2016-07-03 (×3): qty 1

## 2016-07-03 MED ORDER — CALCITRIOL 0.5 MCG PO CAPS
0.5000 ug | ORAL_CAPSULE | ORAL | Status: DC
  Administered 2016-07-05: 0.5 ug via ORAL

## 2016-07-03 MED ORDER — NITROGLYCERIN 2 % TD OINT
1.0000 [in_us] | TOPICAL_OINTMENT | Freq: Four times a day (QID) | TRANSDERMAL | Status: DC
  Administered 2016-07-03 – 2016-07-04 (×5): 1 [in_us] via TOPICAL
  Filled 2016-07-03: qty 1
  Filled 2016-07-03: qty 30

## 2016-07-03 MED ORDER — DARBEPOETIN ALFA 200 MCG/0.4ML IJ SOSY
PREFILLED_SYRINGE | INTRAMUSCULAR | Status: AC
  Administered 2016-07-03: 200 ug via INTRAVENOUS
  Filled 2016-07-03: qty 0.4

## 2016-07-03 MED ORDER — AMLODIPINE BESYLATE 10 MG PO TABS
10.0000 mg | ORAL_TABLET | Freq: Every day | ORAL | Status: DC
  Administered 2016-07-04 – 2016-07-05 (×2): 10 mg via ORAL
  Filled 2016-07-03 (×3): qty 1

## 2016-07-03 MED ORDER — HEPARIN SODIUM (PORCINE) 1000 UNIT/ML DIALYSIS: 1000.0000 [IU] | INTRAMUSCULAR | Status: DC | PRN

## 2016-07-03 MED ORDER — ONDANSETRON HCL 4 MG PO TABS: 4.0000 mg | ORAL_TABLET | Freq: Four times a day (QID) | ORAL | Status: DC | PRN

## 2016-07-03 MED ORDER — SODIUM CHLORIDE 0.9 % IV SOLN: 100.0000 mL | INTRAVENOUS | Status: DC | PRN

## 2016-07-03 MED ORDER — NITROGLYCERIN 2 % TD OINT
TOPICAL_OINTMENT | TRANSDERMAL | Status: AC
  Administered 2016-07-03: 1 [in_us] via TOPICAL
  Filled 2016-07-03: qty 1

## 2016-07-03 MED ORDER — ACETAMINOPHEN 325 MG PO TABS: 650.0000 mg | ORAL_TABLET | Freq: Four times a day (QID) | ORAL | Status: DC | PRN

## 2016-07-03 MED ORDER — FOLIC ACID 1 MG PO TABS
1.0000 mg | ORAL_TABLET | Freq: Every day | ORAL | Status: DC
  Administered 2016-07-03 – 2016-07-05 (×3): 1 mg via ORAL
  Filled 2016-07-03 (×3): qty 1

## 2016-07-03 MED ORDER — ACETAMINOPHEN 650 MG RE SUPP: 650.0000 mg | Freq: Four times a day (QID) | RECTAL | Status: DC | PRN

## 2016-07-03 MED ORDER — DARBEPOETIN ALFA 200 MCG/0.4ML IJ SOSY
200.0000 ug | PREFILLED_SYRINGE | Freq: Once | INTRAMUSCULAR | Status: AC
  Administered 2016-07-03: 200 ug via INTRAVENOUS

## 2016-07-03 MED ORDER — LIDOCAINE HCL (PF) 1 % IJ SOLN: 5.0000 mL | INTRAMUSCULAR | Status: DC | PRN

## 2016-07-03 MED ORDER — ACETAMINOPHEN 325 MG PO TABS
ORAL_TABLET | ORAL | Status: AC
  Filled 2016-07-03: qty 2

## 2016-07-03 MED ORDER — ALTEPLASE 2 MG IJ SOLR: 2.0000 mg | Freq: Once | INTRAMUSCULAR | Status: DC | PRN

## 2016-07-03 MED ORDER — HYDROCODONE-ACETAMINOPHEN 5-325 MG PO TABS
1.0000 | ORAL_TABLET | ORAL | Status: DC | PRN
  Administered 2016-07-03: 2 via ORAL
  Administered 2016-07-04: 1 via ORAL
  Administered 2016-07-05: 2 via ORAL
  Filled 2016-07-03: qty 2
  Filled 2016-07-03: qty 1

## 2016-07-03 MED ORDER — SODIUM CHLORIDE 0.9 % IV SOLN
1.0000 g | Freq: Once | INTRAVENOUS | Status: AC
  Administered 2016-07-03: 1 g via INTRAVENOUS
  Filled 2016-07-03: qty 10

## 2016-07-03 NOTE — ED Provider Notes (Signed)
[facility] DEPT Provider Note   CSN: [id number] Arrival date & time: 07/03/16  0449     History   Chief Complaint Chief Complaint  Patient presents with  . Cough    HPI Nicholas Roach is a 65 y.o. male.  Nicholas Roach is a 65 y.o. Male with a history of end-stage renal disease on dialysis who presents to the emergency department complaining of a cough. He tells me he has missed dialysis approximately 3-4 times. He thinks he last went to dialysis last Friday. He ascended the dialysis clinic at [facility]. He thinks he is followed by nephrologist at the [place]. He tells me he's been having a cough for the past 3-4 days and today has been coughing up some blood. He tells me he has been using cocaine and drinking alcohol while he has not been going to dialysis. He tells me he is been "partying." He has been doing "a little of this and a little of that." He tells me he does make some urine still. He reports feeling sort of breath with exertion. He denies fevers, abdominal pain, nausea, vomiting, rashes, chest pain, palpitations, or diarrhea.    The history is provided by the patient and medical records. No language interpreter was used.  Cough  Associated symptoms include shortness of breath. Pertinent negatives include no chest pain, no chills, no headaches, no sore throat and no wheezing.    Past Medical History:  Diagnosis Date  . CKD (chronic kidney disease) stage 3, GFR 30-59 ml/min   . Hepatitis C   . Hypertension   . Renal disorder   . Renal insufficiency     Patient Active Problem List   Diagnosis Date Noted  . Pulmonary edema 07/03/2016  . Normocytic anemia 05/21/2016  . Melena 05/21/2016  . ESRD (end stage renal disease) on dialysis ([place]) 05/21/2016  . 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 Hepatitis C 05/21/2016  . Hemoptysis 05/21/2016    Past Surgical History:  Procedure Laterality Date  . FOOT FRACTURE SURGERY Left        Home Medications    Prior  to Admission medications   Medication Sig Start Date End Date Taking? Authorizing Provider  amLODipine (NORVASC) 10 MG tablet Take 10 mg by mouth daily.   Yes Historical Provider, MD  calcium acetate (PHOSLO) 667 MG capsule Take 1,334 mg by mouth 3 (three) times daily with meals.   Yes Historical Provider, MD  carvedilol (COREG) 12.5 MG tablet Take 12.5 mg by mouth 2 (two) times daily with a meal.   Yes Historical Provider, MD  doxazosin (CARDURA) 8 MG tablet Take 8 mg by mouth daily.   Yes Historical Provider, MD    Family History Family History  Problem Relation Age of Onset  . Hypertension Mother   . Heart Problems Mother     Social History Social History  Substance Use Topics  . Smoking status: Current Every Day Smoker  . Smokeless tobacco: Never Used  . Alcohol use Yes     Comment: Occasionally     Allergies   Patient has no known allergies.   Review of Systems Review of Systems  Constitutional: Negative for chills and fever.  HENT: Negative for congestion and sore throat.   Eyes: Negative for visual disturbance.  Respiratory: Positive for cough and shortness of breath. Negative for wheezing.   Cardiovascular: Negative for chest pain and palpitations.  Gastrointestinal: Negative for abdominal pain, diarrhea, nausea and vomiting.  Genitourinary: Negative for dysuria.  Musculoskeletal: Negative for back pain and neck pain.  Skin: Negative for rash.  Neurological: Negative for light-headedness and headaches.     Physical Exam Updated Vital Signs BP (!) 182/115 (BP Location: Right Arm)   Pulse 100   Temp 97.8 F (36.6 C) (Oral)   Resp 18   Ht '5\' 10"'$  (1.778 m)   Wt 72.7 kg   SpO2 92%   BMI 22.99 kg/m   Physical Exam  Constitutional: He is oriented to person, place, and time. He appears well-developed and well-nourished. No distress.  HENT:  Head: Normocephalic and atraumatic.  Mouth/Throat: Oropharynx is clear and moist.  Eyes: Conjunctivae are normal.  Pupils are equal, round, and reactive to light. Right eye exhibits no discharge. Left eye exhibits no discharge.  Neck: Neck supple. No JVD present.  Cardiovascular: Regular rhythm and intact distal pulses.  Exam reveals no gallop and no friction rub.   Heart rate 108.   Pulmonary/Chest: Effort normal. No respiratory distress. He has no wheezes. He has no rales.  Lungs sounds slightly diminished bilaterally. No rales or rhonchi noted. No wheezing noted. Oxygen saturation around 86% on room air and comes up to 93% on 2L via [place].   Abdominal: Soft. There is no tenderness. There is no guarding.  Musculoskeletal: He exhibits no edema.  Lymphadenopathy:    He has no cervical adenopathy.  Neurological: He is alert and oriented to person, place, and time. Coordination normal.  Skin: Skin is warm and dry. Capillary refill takes less than 2 seconds. No rash noted. He is not diaphoretic. No erythema. No pallor.  Psychiatric: He has a normal mood and affect. His behavior is normal.  Nursing note and vitals reviewed.    ED Treatments / Results  Labs (all labs ordered are listed, but only abnormal results are displayed) Labs Reviewed  CBC WITH DIFFERENTIAL/PLATELET - Abnormal; Notable for the following:       Result Value   RBC 2.48 (*)    Hemoglobin 7.7 (*)    HCT 21.7 (*)    RDW 16.9 (*)    Platelets 137 (*)    Neutro Abs 7.8 (*)    All other components within normal limits  BRAIN NATRIURETIC PEPTIDE - Abnormal; Notable for the following:    B Natriuretic Peptide 1,731.2 (*)    All other components within normal limits  I-STAT CHEM 8, ED - Abnormal; Notable for the following:    Chloride 100 (*)    BUN 111 (*)    Creatinine, Ser >18.00 (*)    Glucose, Bld 102 (*)    Hemoglobin 7.5 (*)    HCT 22.0 (*)    All other components within normal limits  I-STAT TROPOININ, ED - Abnormal; Notable for the following:    Troponin i, poc 0.09 (*)    All other components within normal limits     EKG  EKG Interpretation  Date/Time:  Thursday July 03 2016 06:20:53 EST Ventricular Rate:  94 PR Interval:    QRS Duration: 80 QT Interval:  363 QTC Calculation: 454 R Axis:   28 Text Interpretation:  Sinus rhythm motion artifact Confirmed by [facility]  MD, APRIL ([id number]) on 07/03/2016 7:07:19 AM       Radiology Dg Chest 2 View  Result Date: 07/03/2016 CLINICAL DATA:  Cough 2 weeks.  Hemoptysis. EXAM: CHEST  2 VIEW COMPARISON:  05/16/2016 FINDINGS: Central airspace opacities are present bilaterally, worse than 05/16/2016. No effusions. Upper normal heart size. Hilar and mediastinal contours  are unremarkable and unchanged. IMPRESSION: Symmetric airspace opacities bilaterally. This could represent alveolar edema. Infectious infiltrates are not excluded. Electronically Signed   By: Andreas Newport M.D.   On: 07/03/2016 05:29    Procedures Procedures (including critical care time)  Medications Ordered in ED Medications  furosemide (LASIX) 120 mg in dextrose 5 % 50 mL IVPB (not administered)  nitroGLYCERIN (NITROGLYN) 2 % ointment 1 inch (1 inch Topical Given 07/03/16 0713)     Initial Impression / Assessment and Plan / ED Course  I have reviewed the triage vital signs and the nursing notes.  Pertinent labs & imaging results that were available during my care of the patient were reviewed by me and considered in my medical decision making (see chart for details).  Clinical Course    This is a 65 y.o. Male with a history of end-stage renal disease on dialysis who presents to the emergency department complaining of a cough. He tells me he has missed dialysis approximately 3-4 times. He thinks he last went to dialysis last Friday. He ascended the dialysis clinic at [facility]. He thinks he is followed by nephrologist at the [place]. He tells me he's been having a cough for the past 3-4 days and today has been coughing up some blood. He tells me he has been using cocaine and  drinking alcohol while he has not been going to dialysis. He tells me he is been "partying." He has been doing "a little of this and a little of that." He tells me he does make some urine still. He reports feeling sort of breath with exertion. He denies fevers. On exam the patient is afebrile and nontoxic appearing. Lung sounds slightly diminished bilaterally. No increased work of breathing. Oxygen saturation on 86% on room air and up to 93% on 2 L nasal cannula. Potassium is 5.1. BUN is 111. Creatinine greater than 18. Hemoglobin is around his baseline at 7.5. BNP elevated at 17,000. Troponin elevated at 0.09. Suspect is related to elevated creatinine. Chest x-ray shows alveolar edema. As the patient makes urine some what will provide him with Lasix. Will also provide him with Nitropaste due to his hypertension. I consulted with nephrologist Dr. Moshe Cipro who would like the patient transferred to [facility] for dialysis today. I consulted with Dr. Tana Coast who accepted the patient for admission and requested temp orders for step down bed.   This patient was discussed with and evaluated by Dr. Randal Buba who agrees with assessment and plan.    Final Clinical Impressions(s) / ED Diagnoses   Final diagnoses:  Acute pulmonary edema (HCC)  ESRD (end stage renal disease) ([place])  Cough  Hypoxia  Cocaine abuse    New Prescriptions New Prescriptions   No medications on file     Waynetta Pean, Hershal Coria 07/03/16 4859    April Palumbo, MD 08/13/16 2254

## 2016-07-03 NOTE — ED Notes (Signed)
Updated Pt on delayed transfer.  Pt verbalized understanding.

## 2016-07-03 NOTE — Progress Notes (Signed)
Pt pcp is Dr Jeneen Rinks at [facility] administration

## 2016-07-03 NOTE — ED Provider Notes (Signed)
Medical screening examination/treatment/procedure(s) were conducted as a shared visit with non-physician practitioner(s) and myself.  I personally evaluated the patient during the encounter. Seen with Will Dansie, see his note.    EKG Interpretation  Date/Time:  Thursday July 03 2016 06:20:53 EST Ventricular Rate:  94 PR Interval:    QRS Duration: 80 QT Interval:  363 QTC Calculation: 595 R Axis:   28 Text Interpretation:  Sinus rhythm motion artifact Confirmed by [facility]  MD, Morgane Joerger ([id number]) on 07/03/2016 7:07:19 AM      Vitals:   07/03/16 0457 07/03/16 0621  BP: 192/98 (!) 182/115  Pulse: 106 100  Resp: 20 18  Temp: 97.8 F (36.6 C)     No distress, alert NCAT RRR with an S3 gallop diminshed BS B NABS, abdomen is soft FROM x4  Results for orders placed or performed during the hospital encounter of 07/03/16  CBC with Differential/Platelet  Result Value Ref Range   WBC 9.2 4.0 - 10.5 K/uL   RBC 2.48 (L) 4.22 - 5.81 MIL/uL   Hemoglobin 7.7 (L) 13.0 - 17.0 g/dL   HCT 21.7 (L) 39.0 - 52.0 %   MCV 87.5 78.0 - 100.0 fL   MCH 31.0 26.0 - 34.0 pg   MCHC 35.5 30.0 - 36.0 g/dL   RDW 16.9 (H) 11.5 - 15.5 %   Platelets 137 (L) 150 - 400 K/uL   Neutrophils Relative % 84 %   Neutro Abs 7.8 (H) 1.7 - 7.7 K/uL   Lymphocytes Relative 7 %   Lymphs Abs 0.7 0.7 - 4.0 K/uL   Monocytes Relative 7 %   Monocytes Absolute 0.6 0.1 - 1.0 K/uL   Eosinophils Relative 2 %   Eosinophils Absolute 0.2 0.0 - 0.7 K/uL   Basophils Relative 0 %   Basophils Absolute 0.0 0.0 - 0.1 K/uL  Brain natriuretic peptide  Result Value Ref Range   B Natriuretic Peptide 1,731.2 (H) 0.0 - 100.0 pg/mL  I-stat chem 8, ed  Result Value Ref Range   Sodium 138 135 - 145 mmol/L   Potassium 5.1 3.5 - 5.1 mmol/L   Chloride 100 (L) 101 - 111 mmol/L   BUN 111 (H) 6 - 20 mg/dL   Creatinine, Ser >18.00 (H) 0.61 - 1.24 mg/dL   Glucose, Bld 102 (H) 65 - 99 mg/dL   Calcium, Ion 1.15 1.15 - 1.40 mmol/L   TCO2  22 0 - 100 mmol/L   Hemoglobin 7.5 (L) 13.0 - 17.0 g/dL   HCT 22.0 (L) 39.0 - 52.0 %  I-stat troponin, ED  Result Value Ref Range   Troponin i, poc 0.09 (HH) 0.00 - 0.08 ng/mL   Comment 3           Dg Chest 2 View  Result Date: 07/03/2016 CLINICAL DATA:  Cough 2 weeks.  Hemoptysis. EXAM: CHEST  2 VIEW COMPARISON:  05/16/2016 FINDINGS: Central airspace opacities are present bilaterally, worse than 05/16/2016. No effusions. Upper normal heart size. Hilar and mediastinal contours are unremarkable and unchanged. IMPRESSION: Symmetric airspace opacities bilaterally. This could represent alveolar edema. Infectious infiltrates are not excluded. Electronically Signed   By: Andreas Newport M.D.   On: 07/03/2016 05:29    Patient states he makes urine 3-4 times a day so we will given him a dose of lasix IV in order to degree the fluid in his lungs.  He will need admission at [facility] and dialysis as he has missed 3 or 4 treatments    Cindee Mclester, MD  07/03/16 0716  

## 2016-07-03 NOTE — ED Notes (Signed)
Attempted to update Pt regarding plan of care.  Pt woke up long enough to verbalize understanding and then, fell back to sleep.

## 2016-07-03 NOTE — ED Triage Notes (Signed)
Pt c/o non-productive, dry cough x 2 days, sts sometimes he cough up blood with it. He reports he stopped going to dialysis, probably missed 3-4 treatments. Pt sts he is tired, and "people just don't understand". He reports he started drinking alcohol, and using drugs (cocaine). He also reports bilateral abdominal pain and sts is worried about his liver.

## 2016-07-03 NOTE — ED Notes (Signed)
Pt transported to HD by [facility].

## 2016-07-03 NOTE — H&P (Signed)
History and Physical        Hospital Admission Note Date: 07/03/2016  Patient name: Nicholas Roach Medical record number: [id number] Date of birth: 25-Mar-1951 Age: 65 y.o. Gender: male  [facility]    Referring physician: Waynetta Pean, PA  Patient coming from: home    Chief Complaint:  Coughing and shortness of breath  HPI: Patient is a 65 year old male with ESRD on hemodialysis Monday Wednesday Friday, hypertension, medical noncompliance, polysubstance abuse, cocaine abuse presented to ED complaining of cough with pinkisk/bloody frothy sputum. Patient reported that he follows [facility] and usually gets dialysis on Monday Wednesday Friday. He definitely did not go to dialysis center this week, he is not even sure that he went for HD on Friday on 12/1. He is followed by nephrology at [facility]. For the last 3-4 days he's been having shortness of breath with coughing and pinkish bloody frothy sputum. Patient reported that he missed the dialysis as he was 'partying', drinking alcohol and using drugs and cocaine. Otherwise no fevers, chest pain, palpitation, nausea or vomiting, any abdominal pain or diarrhea.  ED work-up/course:  Temp 97.8 respiratory rate 20, pulse 106, BP 192/98 O2 sats 89% on 3 L Sodium 138, potassium 5.1, BUN 11, creatinine >18 BNP 1731 Troponin 0.09 Hemoglobin 7.5, hematocrit 22.0  Review of Systems: Positives marked in 'bold' Constitutional: Denies fever, chills, diaphoresis, poor appetite and fatigue.  HEENT: Denies photophobia, eye pain, redness, hearing loss, ear pain, congestion, sore throat, rhinorrhea, sneezing, mouth sores, trouble swallowing, neck pain, neck stiffness and tinnitus.   Respiratory:Please see history of present illness  Cardiovascular: Denies chest pain, palpitations and leg swelling.  Gastrointestinal: Denies nausea,  vomiting, abdominal pain, diarrhea, constipation, blood in stool and abdominal distention.  Genitourinary: Denies dysuria, urgency, frequency, hematuria, flank pain and difficulty urinating.  Musculoskeletal: Denies myalgias, back pain, joint swelling, arthralgias and gait problem.  Skin: Denies pallor, rash and wound.  Neurological: Denies dizziness, seizures, syncope, weakness, light-headedness, numbness and headaches.  Hematological: Denies adenopathy. Easy bruising, personal or family bleeding history  Psychiatric/Behavioral: Denies suicidal ideation, mood changes, confusion, nervousness, sleep disturbance and agitation  Past Medical History: Past Medical History:  Diagnosis Date  . CKD (chronic kidney disease) stage 3, GFR 30-59 ml/min   . Hepatitis C   . Hypertension   . Renal disorder   . Renal insufficiency     Past Surgical History:  Procedure Laterality Date  . FOOT FRACTURE SURGERY Left     Medications: Prior to Admission medications   Medication Sig Start Date End Date Taking? Authorizing Provider  amLODipine (NORVASC) 10 MG tablet Take 10 mg by mouth daily.   Yes Historical Provider, MD  calcium acetate (PHOSLO) 667 MG capsule Take 1,334 mg by mouth 3 (three) times daily with meals.   Yes Historical Provider, MD  carvedilol (COREG) 12.5 MG tablet Take 12.5 mg by mouth 2 (two) times daily with a meal.   Yes Historical Provider, MD  doxazosin (CARDURA) 8 MG tablet Take 8 mg by mouth daily.   Yes Historical Provider, MD    Allergies:  No Known Allergies  Social History:  reports that he has  been smoking.  He has never used smokeless tobacco. He reports that he drinks alcohol. He reports that he uses drugs, including Cocaine.  Family History: Family History  Problem Relation Age of Onset  . Hypertension Mother   . Heart Problems Mother     Physical Exam: Blood pressure (!) 184/115, pulse 110, temperature 97.8 F (36.6 C), temperature source Oral, resp. rate 14,  height '5\' 10"'$  (1.778 m), weight 72.7 kg (160 lb 4 oz), SpO2 93 %. General: Alert, awake, oriented x3, in no acute distress. HEENT: normocephalic, atraumatic, anicteric sclera, pink conjunctiva, pupils equal and reactive to light and accomodation, oropharynx clear Neck: supple, no masses or lymphadenopathy, no goiter, no bruits  Heart: Regular rate and rhythm, without murmurs, rubs or gallops.Tachycardia  Lungs: Clear to auscultation bilaterally, no wheezing, rales or rhonchi. Abdomen: Soft, nontender, nondistended, positive bowel sounds, no masses. Extremities: No clubbing, cyanosis or edema with positive pedal pulses. Neuro: Grossly intact, no focal neurological deficits, strength 5/5 upper and lower extremities bilaterally Psych: alert and oriented x 3, normal mood and affect Skin: no rashes or lesions, warm and dry   LABS on Admission:  Basic Metabolic Panel:  Recent Labs Lab 07/03/16 0621  NA 138  K 5.1  CL 100*  GLUCOSE 102*  BUN 111*  CREATININE >18.00*   Liver Function Tests: No results for input(s): AST, ALT, ALKPHOS, BILITOT, PROT, ALBUMIN in the last 168 hours. No results for input(s): LIPASE, AMYLASE in the last 168 hours. No results for input(s): AMMONIA in the last 168 hours. CBC:  Recent Labs Lab 07/03/16 0609 07/03/16 0621  WBC 9.2  --   NEUTROABS 7.8*  --   HGB 7.7* 7.5*  HCT 21.7* 22.0*  MCV 87.5  --   PLT 137*  --    Cardiac Enzymes: No results for input(s): CKTOTAL, CKMB, CKMBINDEX, TROPONINI in the last 168 hours. BNP: Invalid input(s): POCBNP CBG: No results for input(s): GLUCAP in the last 168 hours.  Radiological Exams on Admission:  No results found.  *I have personally reviewed the images above*  EKG: Independently reviewed. Rate 94, normal sinus rhythm, peaked T waves in the lateral leads   Assessment/Plan Principal Problem:   Acute pulmonary edema (HCC) in the setting of ESRD, missed hemodialysis in the last 1 week, since  noncompliance - Admit to SDU, EDP has discussed with nephrology, Dr. Moshe Cipro, patient will need urgent hemodialysis, patient has acute pulmonary edema with hyperkalemia, creatinine over 18 - he still makes some urine, and given Lasix and nitroglycerin - Transfer to [facility] for [facility] -  Restart PhosLo, further recommendations by nephrology  Active Problems:   Normocytic anemia  - Likely anemia of chronic disease, hemoglobin close to baseline 7.0-7.6 - will defer to nephrology regarding Packed RBC transfusion - will check anemia panel     Renovascular hypertension - Currently elevated due to acute pulmonary edema, missed hemodialysis, noncompliance, has not taken his antihypertensives - Restart oral antihypertensives, place on prn labetalol with parameters    Hemoptysis - Does not appear to be frank hemoptysis per patient's description, likely due to acute flash pulmonary edema from missed hemodialysis    Hyperkalemia - Needs urgent hemodialysis, will give 1 dose of calcium gluconate  Polysubstance abuse, cocaine abuse, alcohol abuse: - place on alcohol withdrawal protocol with CIWA, thiamine, MVI, folate - counseled patient on drugs cessation    DVT prophylaxis: SCDs  CODE STATUS: Full CODE STATUS  Consults called: renal   Family Communication: Admission,  patients condition and plan of care including tests being ordered have been discussed with the patient  who indicates understanding and agree with the plan and Code Status  Admission status: inpatient, sdu, [facility]   Disposition plan: Further plan will depend as patient's clinical course evolves and further radiologic and laboratory data become available. Likely home  At the time of admission, it appears that the appropriate admission status for this patient is INPATIENT . This is judged to be reasonable and necessary in order to provide the required intensity of service to ensure the patient's safety given the presenting  symptoms, physical exam findings, and initial radiographic and laboratory data in the context of their chronic comorbidities.     Time Spent on Admission: 38mns   Maygan Koeller M.D. [facility] 07/03/2016, 8:21 AM Pager: [phone] If 7PM-7AM, please contact night-coverage [email or web address] Password [email or web address]

## 2016-07-03 NOTE — Procedures (Signed)
Patient seen and examined on hemodialysis.  I was present at the dialysis session.  I supervised the session and made changes as appropriate. QB 400, UF goal 4L . Aggressive UF to bring down BP .  Tolerating treatment well.    Treatment adjusted as needed.  Madelon Lips MD [facility]. pgr [phone] 6:22 PM

## 2016-07-03 NOTE — Consult Note (Addendum)
Reason for Consult: To manage dialysis and dialysis related needs Referring Physician: Unknown Nicholas Roach is an 65 y.o. male.  His care is mainly managed at the [place].  He has HTN, hep C and ESRD, bronchogenic carcinoma ?- uncertain the status of that- homelessness and polysubstance abuse.  He is technically a patient of [place]- there on October 23, then had prolonged hospitalization, details not known at this time- had HD on 12/2- signed off after 3 hours, was 2.9 kg above EDW and very hypertensive.  He presented to the ER at [facility] today with SOB, frothy blood tinged sputum.  He freely admits that he has been partying with ETOH and cocaine.  Eval in ER found him to be hypoxic, very hypertensive, K was 5.1, creatinine over 18    Dialyzes at [facility]- MWF 4 hours  EDW 70 (was likely too high). HD Bath 2/2, Dialyzer 180, Heparin none. Access AVF. BFR 400 Calcitriol 0.25 q tx mircera max but not given on 12/2 venofer 50 weekly  No recent labs  Past Medical History:  Diagnosis Date  . CKD (chronic kidney disease) stage 3, GFR 30-59 ml/min   . Hepatitis C   . Hypertension   . Renal disorder   . Renal insufficiency     Past Surgical History:  Procedure Laterality Date  . FOOT FRACTURE SURGERY Left     Family History  Problem Relation Age of Onset  . Hypertension Mother   . Heart Problems Mother     Social History:  reports that he has been smoking.  He has never used smokeless tobacco. He reports that he drinks alcohol. He reports that he uses drugs, including Cocaine.  Allergies: No Known Allergies  Medications: I have reviewed the patient's current medications.   Results for orders placed or performed during the hospital encounter of 07/03/16 (from the past 48 hour(s))  CBC with Differential/Platelet     Status: Abnormal   Collection Time: 07/03/16  6:09 AM  Result Value Ref Range   WBC 9.2 4.0 - 10.5 K/uL   RBC 2.48 (L) 4.22 - 5.81 MIL/uL   Hemoglobin 7.7 (L) 13.0 - 17.0 g/dL    HCT 21.7 (L) 39.0 - 52.0 %   MCV 87.5 78.0 - 100.0 fL   MCH 31.0 26.0 - 34.0 pg   MCHC 35.5 30.0 - 36.0 g/dL   RDW 16.9 (H) 11.5 - 15.5 %   Platelets 137 (L) 150 - 400 K/uL   Neutrophils Relative % 84 %   Neutro Abs 7.8 (H) 1.7 - 7.7 K/uL   Lymphocytes Relative 7 %   Lymphs Abs 0.7 0.7 - 4.0 K/uL   Monocytes Relative 7 %   Monocytes Absolute 0.6 0.1 - 1.0 K/uL   Eosinophils Relative 2 %   Eosinophils Absolute 0.2 0.0 - 0.7 K/uL   Basophils Relative 0 %   Basophils Absolute 0.0 0.0 - 0.1 K/uL  Brain natriuretic peptide     Status: Abnormal   Collection Time: 07/03/16  6:09 AM  Result Value Ref Range   B Natriuretic Peptide 1,731.2 (H) 0.0 - 100.0 pg/mL  I-stat troponin, ED     Status: Abnormal   Collection Time: 07/03/16  6:19 AM  Result Value Ref Range   Troponin i, poc 0.09 (HH) 0.00 - 0.08 ng/mL   Comment 3            Comment: Due to the release kinetics of cTnI, a negative result within the first hours  of the onset of symptoms does not rule out myocardial infarction with certainty. If myocardial infarction is still suspected, repeat the test at appropriate intervals.   I-stat chem 8, ed     Status: Abnormal   Collection Time: 07/03/16  6:21 AM  Result Value Ref Range   Sodium 138 135 - 145 mmol/L   Potassium 5.1 3.5 - 5.1 mmol/L   Chloride 100 (L) 101 - 111 mmol/L   BUN 111 (H) 6 - 20 mg/dL   Creatinine, Ser >18.00 (H) 0.61 - 1.24 mg/dL   Glucose, Bld 102 (H) 65 - 99 mg/dL   Calcium, Ion 1.15 1.15 - 1.40 mmol/L   TCO2 22 0 - 100 mmol/L   Hemoglobin 7.5 (L) 13.0 - 17.0 g/dL   HCT 22.0 (L) 39.0 - 52.0 %    Dg Chest 2 View  Result Date: 07/03/2016 CLINICAL DATA:  Cough 2 weeks.  Hemoptysis. EXAM: CHEST  2 VIEW COMPARISON:  05/16/2016 FINDINGS: Central airspace opacities are present bilaterally, worse than 05/16/2016. No effusions. Upper normal heart size. Hilar and mediastinal contours are unremarkable and unchanged. IMPRESSION: Symmetric airspace opacities  bilaterally. This could represent alveolar edema. Infectious infiltrates are not excluded. Electronically Signed   By: Andreas Newport M.D.   On: 07/03/2016 05:29    ROS: c/o SOB, blood tinged sputa.  Denies CP, fevers, edema, N/V Blood pressure (!) 182/107, pulse 94, temperature 97.8 F (36.6 C), temperature source Oral, resp. rate 16, height '5\' 10"'$  (1.778 m), weight 72.7 kg (160 lb 4 oz), SpO2 90 %.   Assessment/Plan: 65 year old male with HTN, bronchogenic CA, ESRD presents after no HD for 5 days, volume overload, malignant HTN and uremia 1 Resp issues- mostly from missed HD but unsure of the status of this bronchogenic CA.  HD with max UF- likely will need tomorrow as well to make up for lost HD 2 ESRD: MWF at [facility] via AVF- missed tx- semiemergent HD today, and also tomorrow 3 Hypertension: malignant.  Due to  Missed HD likely missed meds and cocaine. Max UF with HD.  Says norvasc and coreg as home meds- doubt he was taking, resume here 4. Anemia of ESRD: will give max aranesp and follow 5. Metabolic Bone Disease: check labs - continue home phoslo and calcitriol  6. Social- homeless and polysubstance abuse- big problem-    GOLDSBOROUGH,KELLIE A 07/03/2016, 4:04 PM   ADDENDUM BY Madelon Lips MD:  Physical Examination:   BP 172/ 100 HR 96 GEN: NAD, sitting in HD chair HEENT: EOMI, sclerae anicteric NECK: + JVD to angle of the mandible PULM: mildly increased WOB, coughing intermittently, diffuse coarse rhonchrous breath sounds bilaterally CV tachycardic, III/VI systolic murmur LUSB and LLSB, + S3 ABD: soft, nondistended, NABS EXT: no LE edema NEURO: AAO x 3, no asterixis, some akathesia-like movements

## 2016-07-04 DIAGNOSIS — J810 Acute pulmonary edema: Principal | ICD-10-CM

## 2016-07-04 DIAGNOSIS — Z992 Dependence on renal dialysis: Secondary | ICD-10-CM

## 2016-07-04 DIAGNOSIS — D649 Anemia, unspecified: Secondary | ICD-10-CM

## 2016-07-04 DIAGNOSIS — N186 End stage renal disease: Secondary | ICD-10-CM

## 2016-07-04 LAB — TYPE AND SCREEN
Blood Product Expiration Date: 201712212359
ISSUE DATE / TIME: 201712071853
Unit Type and Rh: 5100

## 2016-07-04 LAB — CBC
HEMATOCRIT: 25.2 % — AB (ref 39.0–52.0)
HEMOGLOBIN: 8.7 g/dL — AB (ref 13.0–17.0)
MCH: 30.3 pg (ref 26.0–34.0)
MCHC: 34.5 g/dL (ref 30.0–36.0)
MCV: 87.8 fL (ref 78.0–100.0)
Platelets: 147 10*3/uL — ABNORMAL LOW (ref 150–400)
RBC: 2.87 MIL/uL — AB (ref 4.22–5.81)
RDW: 16.6 % — ABNORMAL HIGH (ref 11.5–15.5)
WBC: 6.8 10*3/uL (ref 4.0–10.5)

## 2016-07-04 LAB — BASIC METABOLIC PANEL
ANION GAP: 13 (ref 5–15)
BUN: 39 mg/dL — ABNORMAL HIGH (ref 6–20)
CALCIUM: 8.3 mg/dL — AB (ref 8.9–10.3)
CO2: 29 mmol/L (ref 22–32)
Chloride: 97 mmol/L — ABNORMAL LOW (ref 101–111)
Creatinine, Ser: 9.82 mg/dL — ABNORMAL HIGH (ref 0.61–1.24)
GFR, EST AFRICAN AMERICAN: 6 mL/min — AB (ref >60)
GFR, EST NON AFRICAN AMERICAN: 5 mL/min — AB (ref >60)
Glucose, Bld: 106 mg/dL — ABNORMAL HIGH (ref 65–99)
POTASSIUM: 4.1 mmol/L (ref 3.5–5.1)
Sodium: 139 mmol/L (ref 135–145)

## 2016-07-04 LAB — MRSA PCR SCREENING: MRSA BY PCR: NEGATIVE

## 2016-07-04 MED ORDER — LIDOCAINE-PRILOCAINE 2.5-2.5 % EX CREA: 1.0000 "application " | TOPICAL_CREAM | CUTANEOUS | Status: DC | PRN

## 2016-07-04 MED ORDER — SODIUM CHLORIDE 0.9 % IV SOLN: 100.0000 mL | INTRAVENOUS | Status: DC | PRN

## 2016-07-04 MED ORDER — LIDOCAINE HCL (PF) 1 % IJ SOLN: 5.0000 mL | INTRAMUSCULAR | Status: DC | PRN

## 2016-07-04 MED ORDER — HEPARIN SODIUM (PORCINE) 1000 UNIT/ML DIALYSIS: 1000.0000 [IU] | INTRAMUSCULAR | Status: DC | PRN

## 2016-07-04 MED ORDER — PENTAFLUOROPROP-TETRAFLUOROETH EX AERO: 1.0000 "application " | INHALATION_SPRAY | CUTANEOUS | Status: DC | PRN

## 2016-07-04 MED ORDER — ALTEPLASE 2 MG IJ SOLR: 2.0000 mg | Freq: Once | INTRAMUSCULAR | Status: DC | PRN

## 2016-07-04 NOTE — Progress Notes (Signed)
[facility] Progress Note  Dialysis Orders: [facility]- MWF 4 hours  EDW 70 (was likely too high). HD Bath 2/2, Dialyzer 180, Heparin none. Access AVF. BFR 400 Calcitriol 0.25 q tx mircera max but not given on 12/2 venofer 50 weekly   Assessment/Plan: 65 year old male with HTN, bronchogenic CA, ESRD presents after no HD for 5 days, volume overload, malignant HTN and uremia with cocaine use  1. Respiratory distress/Missed HD/H/o bronchogenic CA - Breathing improved after HD yesterday with net UF 3.9L  2. ESRD - HD yesterday and again today on schedule K 4.1  3. Anemia - Hgb 8.7 Aranesp 223mg with HD yesterday - follow  4. Secondary hyperparathyroidism - Ph 10.3 - likely noncompliance with OP binders - cont Ca acetate here  5. HTN/volume - Malignant HTN BP elevated/resumed Coreag/Norvasc- plan HD today for additional volume removal  6. Nutrition - Albumin 3.4 Renal diet/vitamins/protein supp  7. Social- homeless and polysubstance abuse- big problem-   Ogechi GLarina EarthlyPA-C [facility] Pager [phone]/02/2016,11:50 AM  LOS: 1 day   Subjective: Breathing improved. Appears comfortable No c/os not talking much. Tolerated HD yesterday no c/os   Objective Vitals:   07/03/16 2125 07/03/16 2221 07/04/16 0604 07/04/16 0837  BP: (!) 168/102 (!) 175/86 (!) 148/84 (!) 173/94  Pulse: 99 95 92 92  Resp: '16 17 18 14  '$ Temp:  98.6 F (37 C) 98.8 F (37.1 C) 98.9 F (37.2 C)  TempSrc:  Oral Oral Oral  SpO2:  99% 96% 94%  Weight: 68.8 kg (151 lb 10.8 oz) 68.7 kg (151 lb 7.3 oz)    Height:  '5\' 11"'$  (1.803 m)     Physical Exam General: WNWD AAM NAD, resting  Heart: systolic murmur  Lungs: CTAB  Abdomen: soft  Extremities: no LE edema  Dialysis Access: L AVF +bruit   Additional Objective Labs: Basic Metabolic Panel:  Recent Labs Lab 07/03/16 0621 07/03/16 1805 07/04/16 0604  NA 138 138 139  K 5.1 5.1 4.1  CL 100* 99* 97*  CO2  --  20* 29  GLUCOSE  102* 107* 106*  BUN 111* 113* 39*  CREATININE >18.00* 19.59* 9.82*  CALCIUM  --  9.0 8.3*  PHOS  --  10.3*  --    Liver Function Tests:  Recent Labs Lab 07/03/16 1805  ALBUMIN 3.4*   No results for input(s): LIPASE, AMYLASE in the last 168 hours. CBC:  Recent Labs Lab 07/03/16 0609 07/03/16 0621 07/03/16 1807 07/04/16 0604  WBC 9.2  --  9.2 6.8  NEUTROABS 7.8*  --   --   --   HGB 7.7* 7.5* 7.4* 8.7*  HCT 21.7* 22.0* 21.8* 25.2*  MCV 87.5  --  87.2 87.8  PLT 137*  --  164 147*   Blood Culture No results found for: SDES, SPECREQUEST, CULT, REPTSTATUS  Cardiac Enzymes: No results for input(s): CKTOTAL, CKMB, CKMBINDEX, TROPONINI in the last 168 hours. CBG: No results for input(s): GLUCAP in the last 168 hours. Iron Studies:  Recent Labs  07/03/16 1805  IRON 40*  TIBC 196*  FERRITIN 305   No results found for: INR, PROTIME Medications:  . sodium chloride   Intravenous Once  . sodium chloride   Intravenous Once  . amLODipine  10 mg Oral Daily  . calcitRIOL  0.5 mcg Oral Q M,W,F-1800  . calcium acetate  1,334 mg Oral TID WC  . carvedilol  12.5 mg Oral BID WC  . [START ON  07/10/2016] darbepoetin (ARANESP) injection - DIALYSIS  200 mcg Intravenous Q Thu-HD  . doxazosin  8 mg Oral Daily  . folic acid  1 mg Oral Daily  . multivitamin with minerals  1 tablet Oral Daily  . nitroGLYCERIN  1 inch Topical Q6H  . sodium chloride flush  3 mL Intravenous Q12H  . thiamine  100 mg Oral Daily   Or  . thiamine  100 mg Intravenous Daily

## 2016-07-04 NOTE — Care Management Note (Signed)
Case Management Note  Patient Details  Name: Nicholas Roach MRN: [id number] Date of Birth: [birth year]  Subjective/Objective:   ESRD,  Ongoing polysubstance abuse, respiratory distress, noncompliance              Action/Plan: Discharge Planning:  NCM spoke to pt and states he is living in his [place]. States he still suffers with drugs and drinking mainly due to frustrations. He receives his disability. Had an apartment that he let go but believes he can get another one next month. Wanted info on shelters and rehab programs. States he believes he would benefit from going to drug rehab program. CSW referral for shelters and drug rehab programs. Pt may need oxygen at dc. This will have to arranged with [facility] DME provider. Notified [facility] of admission.   PCP Ch[place]   Expected Discharge Date:              Expected Discharge Plan:  Homeless Shelter  In-House Referral:  Clinical Social Work  Discharge planning Services  CM Consult  Post Acute Care Choice:  NA Choice offered to:  NA  DME Arranged:  N/A DME Agency:  NA  HH Arranged:  NA HH Agency:  NA  Status of Service:  In process, will continue to follow  If discussed at Long Length of Stay Meetings, dates discussed:    Additional Comments:  Erenest Rasher, RN 07/04/2016, 3:20 PM

## 2016-07-04 NOTE — Progress Notes (Signed)
PROGRESS NOTE    Nicholas Roach  JEH:[id number] DOB: 1950/09/27 DOA: 07/03/2016 PCP: [facility]   Brief Narrative:  This is a 65 year old male with end-stage renal disease on hemodialysis, he missed about a week of hemodialysis treatments. Last 3-4 days he had been developing worsening shortness of breath, cough with frothy sputum. On initial physical examination he was found volume overload with hyperkalemia, he underwent emergent hemodialysis. He'll be dialyzed for a second consecutive day.   Assessment & Plan:   Principal Problem:   Acute pulmonary edema (HCC) Active Problems:   Normocytic anemia   ESRD (end stage renal disease) on dialysis (HCC)   Renovascular hypertension   Hemoptysis   Hyperkalemia   1. ESRD ON HD. Will continue HD per nephrology recommendations, for HD today and had HD yesterday. Will reinforce compliance.   2. HTN. Will continue blood pressure monitoring, systolic at 378'H, will continue amlodipine, coreg.   3. Anemia of chronic renal disease, Cell counts stable. Continue aranesp.   4. Hyperparathyroid. Will continue phosphate binders, calictriolfollow on electrolytes.   5. Substance abuse.  No agitation or confusion, will hold on benzodiazepines, for now, no signs of withdrawal.    DVT prophylaxis: heparin  Code Status: full  Family Communication: no family at the bedside  Disposition Plan: Home   Consultants:   Nephrology   Procedures:   Antimicrobials:    Subjective: Feeling better after the HD last night, no nausea or vomiting, dyspnea has improved. Patient did not go to HD as outpatient because "didn't want to".   Objective: Vitals:   07/03/16 2221 07/04/16 0604 07/04/16 0837 07/04/16 1304  BP: (!) 175/86 (!) 148/84 (!) 173/94 (!) 158/93  Pulse: 95 92 92 83  Resp: '17 18 14   '$ Temp: 98.6 F (37 C) 98.8 F (37.1 C) 98.9 F (37.2 C)   TempSrc: Oral Oral Oral   SpO2: 99% 96% 94% 97%  Weight: 68.7 kg (151 lb 7.3 oz)       Height: '5\' 11"'$  (1.803 m)       Intake/Output Summary (Last 24 hours) at 07/04/16 1314 Last data filed at 07/04/16 1040  Gross per 24 hour  Intake             1058 ml  Output             3900 ml  Net            -2842 ml   Filed Weights   07/03/16 0457 07/03/16 2125 07/03/16 2221  Weight: 72.7 kg (160 lb 4 oz) 68.8 kg (151 lb 10.8 oz) 68.7 kg (151 lb 7.3 oz)    Examination:  General exam:  Not in pain or dyspnea E ENT: no pallor or icterus.  Respiratory system: Clear to auscultation. Respiratory effort normal. No wheezing, rales or rhonchi.  Cardiovascular system: S1 & S2 heard, RRR. No JVD, murmurs, rubs, gallops or clicks. No pedal edema. Gastrointestinal system: Abdomen is nondistended, soft and nontender. No organomegaly or masses felt. Normal bowel sounds heard. Central nervous system: Alert and oriented. No focal neurological deficits. Extremities: Symmetric 5 x 5 power. Skin: No rashes, lesions or ulcers   Data Reviewed: I have personally reviewed following labs and imaging studies  CBC:  Recent Labs Lab 07/03/16 0609 07/03/16 0621 07/03/16 1807 07/04/16 0604  WBC 9.2  --  9.2 6.8  NEUTROABS 7.8*  --   --   --   HGB 7.7* 7.5* 7.4* 8.7*  HCT 21.7* 22.0*  21.8* 25.2*  MCV 87.5  --  87.2 87.8  PLT 137*  --  164 010*   Basic Metabolic Panel:  Recent Labs Lab 07/03/16 0621 07/03/16 1805 07/04/16 0604  NA 138 138 139  K 5.1 5.1 4.1  CL 100* 99* 97*  CO2  --  20* 29  GLUCOSE 102* 107* 106*  BUN 111* 113* 39*  CREATININE >18.00* 19.59* 9.82*  CALCIUM  --  9.0 8.3*  PHOS  --  10.3*  --    GFR: Estimated Creatinine Clearance: 7.5 mL/min (by C-G formula based on SCr of 9.82 mg/dL (H)). Liver Function Tests:  Recent Labs Lab 07/03/16 1805  ALBUMIN 3.4*   No results for input(s): LIPASE, AMYLASE in the last 168 hours. No results for input(s): AMMONIA in the last 168 hours. Coagulation Profile: No results for input(s): INR, PROTIME in the last 168  hours. Cardiac Enzymes: No results for input(s): CKTOTAL, CKMB, CKMBINDEX, TROPONINI in the last 168 hours. BNP (last 3 results) No results for input(s): PROBNP in the last 8760 hours. HbA1C: No results for input(s): HGBA1C in the last 72 hours. CBG: No results for input(s): GLUCAP in the last 168 hours. Lipid Profile: No results for input(s): CHOL, HDL, LDLCALC, TRIG, CHOLHDL, LDLDIRECT in the last 72 hours. Thyroid Function Tests: No results for input(s): TSH, T4TOTAL, FREET4, T3FREE, THYROIDAB in the last 72 hours. Anemia Panel:  Recent Labs  07/03/16 1805 07/03/16 1806 07/03/16 1807  VITAMINB12 507  --   --   FOLATE  --  25.0  --   FERRITIN 305  --   --   TIBC 196*  --   --   IRON 40*  --   --   RETICCTPCT  --   --  1.1   Sepsis Labs: No results for input(s): PROCALCITON, LATICACIDVEN in the last 168 hours.  Recent Results (from the past 240 hour(s))  MRSA PCR Screening     Status: None   Collection Time: 07/04/16  5:21 AM  Result Value Ref Range Status   MRSA by PCR NEGATIVE NEGATIVE Final    Comment:        The GeneXpert MRSA Assay (FDA approved for NASAL specimens only), is one component of a comprehensive MRSA colonization surveillance program. It is not intended to diagnose MRSA infection nor to guide or monitor treatment for MRSA infections.          Radiology Studies: Dg Chest 2 View  Result Date: 07/03/2016 CLINICAL DATA:  Cough 2 weeks.  Hemoptysis. EXAM: CHEST  2 VIEW COMPARISON:  05/16/2016 FINDINGS: Central airspace opacities are present bilaterally, worse than 05/16/2016. No effusions. Upper normal heart size. Hilar and mediastinal contours are unremarkable and unchanged. IMPRESSION: Symmetric airspace opacities bilaterally. This could represent alveolar edema. Infectious infiltrates are not excluded. Electronically Signed   By: Andreas Newport M.D.   On: 07/03/2016 05:29        Scheduled Meds: . sodium chloride   Intravenous Once  .  sodium chloride   Intravenous Once  . amLODipine  10 mg Oral Daily  . calcitRIOL  0.5 mcg Oral Q M,W,F-1800  . calcium acetate  1,334 mg Oral TID WC  . carvedilol  12.5 mg Oral BID WC  . [START ON 07/10/2016] darbepoetin (ARANESP) injection - DIALYSIS  200 mcg Intravenous Q Thu-HD  . doxazosin  8 mg Oral Daily  . folic acid  1 mg Oral Daily  . multivitamin with minerals  1 tablet Oral Daily  .  nitroGLYCERIN  1 inch Topical Q6H  . sodium chloride flush  3 mL Intravenous Q12H  . thiamine  100 mg Oral Daily   Or  . thiamine  100 mg Intravenous Daily   Continuous Infusions:   LOS: 1 day        Mauricio Gerome Apley, MD [facility] Pager [phone]  If 7PM-7AM, please contact night-coverage [email or web address] Password [email or web address] 07/04/2016, 1:14 PM

## 2016-07-05 ENCOUNTER — Encounter (HOSPITAL_COMMUNITY): Payer: Self-pay | Admitting: Emergency Medicine

## 2016-07-05 ENCOUNTER — Emergency Department (HOSPITAL_COMMUNITY)
Admission: EM | Admit: 2016-07-05 | Discharge: 2016-07-06 | Disposition: A | Discharge status: Home / Self Care | Payer: Non-veteran care | Attending: Emergency Medicine | Admitting: Emergency Medicine

## 2016-07-05 ENCOUNTER — Emergency Department (HOSPITAL_COMMUNITY): Payer: Non-veteran care

## 2016-07-05 DIAGNOSIS — E875 Hyperkalemia: Secondary | ICD-10-CM

## 2016-07-05 DIAGNOSIS — M5416 Radiculopathy, lumbar region: Secondary | ICD-10-CM | POA: Diagnosis not present

## 2016-07-05 DIAGNOSIS — I150 Renovascular hypertension: Secondary | ICD-10-CM

## 2016-07-05 DIAGNOSIS — M545 Low back pain, unspecified: Secondary | ICD-10-CM

## 2016-07-05 DIAGNOSIS — N186 End stage renal disease: Secondary | ICD-10-CM | POA: Insufficient documentation

## 2016-07-05 DIAGNOSIS — I120 Hypertensive chronic kidney disease with stage 5 chronic kidney disease or end stage renal disease: Secondary | ICD-10-CM | POA: Insufficient documentation

## 2016-07-05 DIAGNOSIS — F17200 Nicotine dependence, unspecified, uncomplicated: Secondary | ICD-10-CM | POA: Insufficient documentation

## 2016-07-05 DIAGNOSIS — Z992 Dependence on renal dialysis: Secondary | ICD-10-CM | POA: Insufficient documentation

## 2016-07-05 LAB — BASIC METABOLIC PANEL
ANION GAP: 9 (ref 5–15)
BUN: 16 mg/dL (ref 6–20)
CALCIUM: 7.9 mg/dL — AB (ref 8.9–10.3)
CO2: 31 mmol/L (ref 22–32)
Chloride: 94 mmol/L — ABNORMAL LOW (ref 101–111)
Creatinine, Ser: 5.45 mg/dL — ABNORMAL HIGH (ref 0.61–1.24)
GFR, EST AFRICAN AMERICAN: 12 mL/min — AB (ref >60)
GFR, EST NON AFRICAN AMERICAN: 10 mL/min — AB (ref >60)
GLUCOSE: 103 mg/dL — AB (ref 65–99)
Potassium: 3.5 mmol/L (ref 3.5–5.1)
SODIUM: 134 mmol/L — AB (ref 135–145)

## 2016-07-05 MED ORDER — SODIUM CHLORIDE 0.9 % IV SOLN: 100.0000 mL | INTRAVENOUS | Status: DC | PRN

## 2016-07-05 MED ORDER — ONDANSETRON HCL 4 MG/2ML IJ SOLN
4.0000 mg | Freq: Once | INTRAMUSCULAR | Status: AC
  Administered 2016-07-05: 4 mg via INTRAVENOUS
  Filled 2016-07-05: qty 2

## 2016-07-05 MED ORDER — HYDROMORPHONE HCL 2 MG/ML IJ SOLN
1.0000 mg | Freq: Once | INTRAMUSCULAR | Status: DC
  Filled 2016-07-05: qty 1

## 2016-07-05 MED ORDER — CALCITRIOL 0.5 MCG PO CAPS
ORAL_CAPSULE | ORAL | Status: AC
  Administered 2016-07-05: 0.5 ug via ORAL
  Filled 2016-07-05: qty 1

## 2016-07-05 MED ORDER — HYDROMORPHONE HCL 2 MG/ML IJ SOLN
1.0000 mg | Freq: Once | INTRAMUSCULAR | Status: AC
  Administered 2016-07-05: 1 mg via INTRAVENOUS
  Filled 2016-07-05: qty 1

## 2016-07-05 MED ORDER — HYDROMORPHONE HCL 2 MG/ML IJ SOLN
1.0000 mg | Freq: Once | INTRAMUSCULAR | Status: AC
  Administered 2016-07-05: 1 mg via INTRAVENOUS

## 2016-07-05 MED ORDER — HYDROCODONE-ACETAMINOPHEN 5-325 MG PO TABS
ORAL_TABLET | ORAL | Status: AC
  Administered 2016-07-05: 2 via ORAL
  Filled 2016-07-05: qty 2

## 2016-07-05 MED ORDER — CALCITRIOL 0.5 MCG PO CAPS: 0.5000 ug | ORAL_CAPSULE | ORAL | 0 refills | Status: DC

## 2016-07-05 NOTE — ED Notes (Signed)
Patient transported to X-ray 

## 2016-07-05 NOTE — Progress Notes (Signed)
Reviewed discharge instructions and medications with patient; all questions answered. Printed prescription given to patient. Assessment is as charted. Patient is leaving unit ambulatory per request and is in stable condition.

## 2016-07-05 NOTE — ED Provider Notes (Signed)
Sign out from Dr. Canary Brim  MRI of lumbar spine pending. If negative and patient ambulatory, patient can be discharged home.  Patient given additional 2 mg of Dilaudid throughout ED course and pressures stable. Patient still complaining of pain and now itching. Benadryl ordered.   Patient continues to be motor and sensory intact in his lower extremities.  Patient went to MRI and could not tolerate due to pain. Patient agreed to retry with Ativan. Successful completion of MRI with findings of  1. Broad left foraminal/far lateral disc protrusion at L4-5, contacting the transiting left L4 nerve root. This is new from prior study. Moderate to severe left foraminal stenosis at this level has worsened. 2. Broad right subarticular disc protrusion at L5-S1, closely approximating the right S1 nerve root without frank neural impingement, stable. 3. Chronic right extraforaminal disc protrusion at L2-3, closely approximating the transiting right L2 nerve root, stable.  Patient still very sedated, but able to bear weight on the left leg. Will let patient sleep and ambulate prior to discharge. At shift change, patient care transferred to Quincy Carnes, PA-C for continued evaluation, follow up of ambulation and determination of disposition. Discharge home with pain control, steroid taper, NSAIDs, with follow-up to neurosurgery. I discussed patient case with Dr. Leonides Schanz who guided the patient's management and agrees with plan.     Frederica Kuster, PA-C 07/06/16 [place], MD 07/06/16 [phone]

## 2016-07-05 NOTE — ED Provider Notes (Signed)
[facility] DEPT Provider Note   CSN: [id number] Arrival date & time: 07/05/16  2102     History   Chief Complaint Chief Complaint  Patient presents with  . Hip Pain    HPI Nicholas Roach is a 65 y.o. male.  HPI  Pt presenting with acute onset of left hip pain.  He has hx of ESRD and was discharged after emergent dialysis for fluid overload.  He also has a hx of polysubstance abuse.   He states he was stepping out of his [employer] and stepped wrong, he had immediate onset of left hip and lower back pain.  He is not able to bear weight on his hip.  No fall.  No significant trauma.  Pt has hx of back pain on right side with nerve impingement- but he denies hx of left sided back pain.  No weakness of leg.  No incontinence of bowel or bladder.  No numbness of leg.  The pain does radiate down left leg.  There are no other associated systemic symptoms, there are no other alleviating or modifying factors. He is not able to take nsaids due to renal disease, has not tried anything for his symptoms.  Pain is not relieved by different positions or by lying still.    Past Medical History:  Diagnosis Date  . CKD (chronic kidney disease) stage 3, GFR 30-59 ml/min   . Hepatitis C   . Hypertension   . Renal disorder   . Renal insufficiency     Patient Active Problem List   Diagnosis Date Noted  . Pulmonary edema 07/03/2016  . Acute pulmonary edema ([place]) 07/03/2016  . Hyperkalemia 07/03/2016  . Normocytic anemia 05/21/2016  . Melena 05/21/2016  . ESRD (end stage renal disease) on dialysis ([place]) 05/21/2016  . 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 Hepatitis C 05/21/2016  . Hemoptysis 05/21/2016    Past Surgical History:  Procedure Laterality Date  . FOOT FRACTURE SURGERY Left        Home Medications    Prior to Admission medications   Medication Sig Start Date End Date Taking? Authorizing Provider  amLODipine (NORVASC) 10 MG tablet Take 10 mg by mouth daily.   Yes Historical  Provider, MD  calcium acetate (PHOSLO) 667 MG capsule Take 1,334 mg by mouth 3 (three) times daily with meals.   Yes Historical Provider, MD  carvedilol (COREG) 12.5 MG tablet Take 12.5 mg by mouth 2 (two) times daily with a meal.   Yes Historical Provider, MD  doxazosin (CARDURA) 8 MG tablet Take 8 mg by mouth daily.   Yes Historical Provider, MD  calcitRIOL (ROCALTROL) 0.5 MCG capsule Take 1 capsule (0.5 mcg total) by mouth every Monday, Wednesday, and Friday at 6 PM. 07/07/16   Mauricio Gerome Apley, MD  oxyCODONE-acetaminophen (PERCOCET/ROXICET) 5-325 MG tablet Take 1 tablet by mouth every 4 (four) hours as needed. 07/06/16   Larene Pickett, PA-C  predniSONE (STERAPRED UNI-PAK 21 TAB) 10 MG (21) TBPK tablet Take 1 tablet (10 mg total) by mouth daily. Take 6 tabs by mouth daily  for 2 days, then 5 tabs for 2 days, then 4 tabs for 2 days, then 3 tabs for 2 days, 2 tabs for 2 days, then 1 tab by mouth daily for 2 days 07/06/16   Larene Pickett, PA-C    Family History Family History  Problem Relation Age of Onset  . Hypertension Mother   . Heart Problems Mother     Social History Social  History  Substance Use Topics  . Smoking status: Current Every Day Smoker  . Smokeless tobacco: Never Used  . Alcohol use Yes     Comment: Occasionally     Allergies   Patient has no known allergies.   Review of Systems Review of Systems  ROS reviewed and all otherwise negative except for mentioned in HPI   Physical Exam Updated Vital Signs BP 164/98   Pulse 78   Temp 97.6 F (36.4 C) (Oral)   Resp 16   Ht '5\' 10"'$  (1.778 m)   Wt 155 lb (70.3 kg)   SpO2 99%   BMI 22.24 kg/m  Vitals reviewed Physical Exam  Physical Examination: General appearance - alert, well appearing, and in no distress Mental status - alert, oriented to person, place, and time Eyes - no conjunctival injection, no scleral icterus Chest - clear to auscultation, no wheezes, rales or rhonchi, symmetric air  entry Heart - normal rate, regular rhythm, normal S1, S2, no murmurs, rubs, clicks or gallops Abdomen - soft, nontender, nondistended, no masses or organomegaly Back exam - no midline tenderness to palpation of c/t/l spine, ttp over posterior left iliac crest, no CVA tenderness Neurological - alert, oriented, normal speech, writhing around in bed in pain- moving all extremities with full strength, will no cooperate with full strength testing or reflexes due to pain and constant movement in stretcher Musculoskeletal - no joint tenderness, deformity or swelling Extremities - peripheral pulses normal, no pedal edema, no clubbing or cyanosis Skin - normal coloration and turgor, no rashes   ED Treatments / Results  Labs (all labs ordered are listed, but only abnormal results are displayed) Labs Reviewed - No data to display  EKG  EKG Interpretation None       Radiology Mr Lumbar Spine Wo Contrast  Result Date: 07/06/2016 CLINICAL DATA:  Initial evaluation for low back pain and left hip pain. EXAM: MRI LUMBAR SPINE WITHOUT CONTRAST TECHNIQUE: Multiplanar, multisequence MR imaging of the lumbar spine was performed. No intravenous contrast was administered. COMPARISON:  Prior MRI from 12/27/2014. FINDINGS: Segmentation: Normal segmentation. Lowest well-formed disc is labeled the L5-S1 level. Alignment: Mild levoscoliosis. Vertebral bodies otherwise normally aligned with preservation of the normal lumbar lordosis. No listhesis. Vertebrae: Mild chronic height loss at the superior endplate of I77, stable. No evidence for acute fracture. Signal intensity within the vertebral body bone marrow diffusely decreased on T1 signal intensity and somewhat heterogeneous, likely related to history of end-stage renal disease. No concerning osseous lesions. Conus medullaris: Extends to the L1-2 level and appears normal. Paraspinal and other soft tissues: Paraspinous soft tissues demonstrate no acute abnormality.  Kidneys are atrophic with innumerable cysts, compatible with history of end-stage renal disease. Intra- abdominal aorta is tortuous. Disc levels: Mild diffuse congenital shortening of the pedicles. L1-2:  Negative. L2-3: Diffuse degenerative disc bulge with disc desiccation and intervertebral disc space narrowing. Right far lateral disc protrusion closely approximates the exiting right L2 nerve root (series 6, image 16). This is stable from prior. Superimposed mild facet hypertrophy. No significant canal stenosis. Mild right foraminal narrowing stable. L3-4: Diffuse degenerative disc bulge with disc desiccation and intervertebral disc space narrowing. No focal disc protrusion. Mild facet hypertrophy. No significant canal stenosis. Mild left foraminal narrowing, stable. L4-5: Diffuse degenerative disc bulge with disc desiccation. There is a broad left foraminal/extraforaminal disc protrusion, not definite seen on previous exam (series 6, image 27). Worsened left foraminal stenosis, now moderate to severe in nature (series 4, image  11). Right neural foramen remains patent. Mild left lateral recess stenosis without significant canal narrowing. Superimposed mild bilateral facet arthrosis, left greater than right, with ligamentum flavum hypertrophy. No significant canal stenosis. L5-S1: Broad right subarticular disc protrusion extending into the right lateral recess, closely approximating but not compressing or displacing the right S1 nerve root, similar to prior. No significant canal stenosis. Mild to moderate bilateral foraminal narrowing is unchanged. IMPRESSION: 1. Broad left foraminal/far lateral disc protrusion at L4-5, contacting the transiting left L4 nerve root. This is new from prior study. Moderate to severe left foraminal stenosis at this level has worsened. 2. Broad right subarticular disc protrusion at L5-S1, closely approximating the right S1 nerve root without frank neural impingement, stable. 3. Chronic  right extraforaminal disc protrusion at L2-3, closely approximating the transiting right L2 nerve root, stable. Electronically Signed   By: Jeannine Boga M.D.   On: 07/06/2016 05:59   Dg Hip Unilat W Or Wo Pelvis 2-3 Views Left  Result Date: 07/05/2016 CLINICAL DATA:  Left hip pain for 1 day. EXAM: DG HIP (WITH OR WITHOUT PELVIS) 2-3V LEFT COMPARISON:  None. FINDINGS: The cortical margins of the bony pelvis and left hip are intact. No fracture. Pubic symphysis and sacroiliac joints are congruent. Both femoral heads are well-seated in the respective acetabula. Mild bilateral hip degenerative change. Vascular calcifications are seen. IMPRESSION: Negative for acute fracture or dislocation of the left hip. Electronically Signed   By: Jeb Levering M.D.   On: 07/05/2016 23:34    Procedures Procedures (including critical care time)  Medications Ordered in ED Medications  HYDROmorphone (DILAUDID) injection 1 mg (1 mg Intravenous Given 07/05/16 2152)  HYDROmorphone (DILAUDID) injection 1 mg (1 mg Intravenous Given 07/05/16 2241)  ondansetron (ZOFRAN) injection 4 mg (4 mg Intravenous Given 07/05/16 2312)  LORazepam (ATIVAN) injection 1 mg (1 mg Intravenous Given 07/06/16 0457)  diphenhydrAMINE (BENADRYL) injection 25 mg (25 mg Intravenous Given 07/06/16 0457)  methylPREDNISolone sodium succinate (SOLU-MEDROL) 125 mg/2 mL injection 125 mg (125 mg Intravenous Given 07/06/16 0647)     Initial Impression / Assessment and Plan / ED Course  I have reviewed the triage vital signs and the nursing notes.  Pertinent labs & imaging results that were available during my care of the patient were reviewed by me and considered in my medical decision making (see chart for details).  Clinical Course   10:40 PM pt was sent back from xray as he was not able to lie flat due to pain.  He states the first dose of dilaudid helped.  He points directly over his posterior iliac crest and states that when he lies flat  this area hurts.  He is moving legs around with full strength.  No numbness of legs.  D/w patient that we will give him another dose of medication, but we need to have the xray done to ensure no fracture.  Symptoms could be due to sciatica, but less likely as he seems to have bony point tenderness over posterior iliac crest.   Pt with acute onset of left hip/low back pain.  No weakness of legs, no incontinence of urine.  Bony point tenderness over posterior iliac crest- xray of pelvis/hip reassuring.  Pt has hx of polysubstance abuse and is currently homeless.  meds ordered in the ED so that he could be able to lie for xray exam.  He states he is not able to walk.  He has a hx of right lumbar radiculopathy with disc extrusion-  will obtain MRI to evaluate lumbar spine as he states he cannot walk.  Pt will likely need SW consult if he is still here in the morning as well.  Pt signed out at end of shift pending MRI, pain control and reassessment.    Final Clinical Impressions(s) / ED Diagnoses   Final diagnoses:  Lumbar pain  Lumbar radiculopathy    New Prescriptions Discharge Medication List as of 07/06/2016 10:50 AM    START taking these medications   Details  oxyCODONE-acetaminophen (PERCOCET/ROXICET) 5-325 MG tablet Take 1 tablet by mouth every 4 (four) hours as needed., Starting Sun 07/06/2016, Print    predniSONE (STERAPRED UNI-PAK 21 TAB) 10 MG (21) TBPK tablet Take 1 tablet (10 mg total) by mouth daily. Take 6 tabs by mouth daily  for 2 days, then 5 tabs for 2 days, then 4 tabs for 2 days, then 3 tabs for 2 days, 2 tabs for 2 days, then 1 tab by mouth daily for 2 days, Starting Sun 07/06/2016, Print         Alfonzo Beers, MD 07/06/16 [phone]

## 2016-07-05 NOTE — ED Notes (Signed)
This RN in room with pt to administer zofran. Pt vomited approximately 141m into an emesis bag.

## 2016-07-05 NOTE — Discharge Summary (Signed)
Physician Discharge Summary  Nicholas Roach:[id number] DOB: 31-Dec-1950 DOA: 07/03/2016  PCP: [place] date: 07/03/2016 Discharge date: 07/05/2016  Admitted From: Home  Disposition:  Home  Recommendations for Outpatient Follow-up:  1. Follow up with PCP in 1-week. 2. Was counseled about compliance with hemodialysis  Home Health: No  Equipment/Devices: No   Discharge Condition: Stable  CODE STATUS: full   Diet recommendation: Renal diet.    Brief/Interim Summary: This is a 65 year old male who presented to hospital with a chief complaint of coughing and dyspnea. Apparently he missed about 1 week of hemodialysis treatments. For last 3-4 days prior to admission, he developed worsening shortness of breath and cough with frothy pinkish sputum. He admitted using cocaine and drinking alcohol. On initial physical examination his blood pressure was 184/115, heart rate 110, temperature 97.8 He was awake and alert, his lungs were clear to auscultation, heart S1-S2 present rhythmic, his abdomen was soft, lower extremities with no significant edema. Sodium 138, potassium 5.1, chloride 99, BUN 113, creatinine 19.5, glucose 107, BNP 1731, troponin 0.09. White count 9.2, hemoglobin 7.7, hematocrit 21.7, platelets 137. His chest film had a vascular congestion bilaterally. His EKG was normal sinus rhythm.  The patient was admitted to the hospital working diagnosis of acute pulmonary edema related to volume overload due to end-stage renal disease with noncompliance with hemodialysis complicated by normocytic anemia and hyperkalemia.  1. Volume overload due to end-stage renal disease. He was admitted to the telemetry unit, he underwent hemodialysis with ultrafiltration for 2 consecutive days, with significant improvement of his symptoms. Patient was counseled about compliance with hemodialysis treatments. Patient's oxygenation by the time of discharge was 98% on room air.   2.  Hypertension. Blood pressure improved after hemodialysis, patient was continued on amlodipine and Coreg.  3. Anemia of chronic renal disease. Hemoglobin at the time of discharge 8.7 patient received darbepoetin.   4. Secondary hyperparathyroidism. Due to chronic kidney disease, patient was continued on phosphate binders and calcitriol.  5. Substance abuse. No signs of withdrawal, patient was concealed about avoiding illegal substance use.     Discharge Diagnoses:  Principal Problem:   Acute pulmonary edema (HCC) Active Problems:   Normocytic anemia   ESRD (end stage renal disease) on dialysis ([place])   Renovascular hypertension   Hemoptysis   Hyperkalemia    Discharge Instructions  Discharge Instructions    Diet - low sodium heart healthy    Complete by:  As directed    Discharge instructions    Complete by:  As directed    Please follow with primary care in 7 days and with hemodialysis schedule.   Increase activity slowly    Complete by:  As directed        Medication List    TAKE these medications   amLODipine 10 MG tablet Commonly known as:  NORVASC Take 10 mg by mouth daily.   calcitRIOL 0.5 MCG capsule Commonly known as:  ROCALTROL Take 1 capsule (0.5 mcg total) by mouth every Monday, Wednesday, and Friday at 6 PM. Start taking on:  07/07/2016   calcium acetate 667 MG capsule Commonly known as:  PHOSLO Take 1,334 mg by mouth 3 (three) times daily with meals.   carvedilol 12.5 MG tablet Commonly known as:  COREG Take 12.5 mg by mouth 2 (two) times daily with a meal.   doxazosin 8 MG tablet Commonly known as:  CARDURA Take 8 mg by mouth daily.  Follow-up Information    [facility] Follow up in 1 week(s).   Contact information: [place] [id number] [phone]          No Known Allergies  Consultations:  Nephrology    Procedures/Studies: Dg Chest 2 View  Result Date:  07/03/2016 CLINICAL DATA:  Cough 2 weeks.  Hemoptysis. EXAM: CHEST  2 VIEW COMPARISON:  05/16/2016 FINDINGS: Central airspace opacities are present bilaterally, worse than 05/16/2016. No effusions. Upper normal heart size. Hilar and mediastinal contours are unremarkable and unchanged. IMPRESSION: Symmetric airspace opacities bilaterally. This could represent alveolar edema. Infectious infiltrates are not excluded. Electronically Signed   By: Andreas Newport M.D.   On: 07/03/2016 05:29       Subjective: Patient feeling better, no nausea or vomiting, no chest pain or dyspnea.   Discharge Exam: Vitals:   07/05/16 0833 07/05/16 0920  BP:  135/69  Pulse: 76 90  Resp:  18  Temp:  98.6 F (37 C)   Vitals:   07/05/16 0245 07/05/16 0529 07/05/16 0833 07/05/16 0920  BP: 116/79 (!) 149/83  135/69  Pulse: 77 74 76 90  Resp: '16 18  18  '$ Temp: 98.3 F (36.8 C) 98.5 F (36.9 C)  98.6 F (37 C)  TempSrc: Oral Oral  Oral  SpO2: 96% 99%  98%  Weight: 67.8 kg (149 lb 7.6 oz)     Height:        General: Pt is alert, awake, not in acute distress Cardiovascular: RRR, S1/S2 +, no rubs, no gallops Respiratory: CTA bilaterally, no wheezing, no rhonchi Abdominal: Soft, NT, ND, bowel sounds + Extremities: no edema, no cyanosis    The results of significant diagnostics from this hospitalization (including imaging, microbiology, ancillary and laboratory) are listed below for reference.     Microbiology: Recent Results (from the past 240 hour(s))  MRSA PCR Screening     Status: None   Collection Time: 07/04/16  5:21 AM  Result Value Ref Range Status   MRSA by PCR NEGATIVE NEGATIVE Final    Comment:        The GeneXpert MRSA Assay (FDA approved for NASAL specimens only), is one component of a comprehensive MRSA colonization surveillance program. It is not intended to diagnose MRSA infection nor to guide or monitor treatment for MRSA infections.      Labs: BNP (last 3  results)  Recent Labs  07/03/16 0609  BNP 5,003.7*   Basic Metabolic Panel:  Recent Labs Lab 07/03/16 0621 07/03/16 1805 07/04/16 0604 07/05/16 0614  NA 138 138 139 134*  K 5.1 5.1 4.1 3.5  CL 100* 99* 97* 94*  CO2  --  20* 29 31  GLUCOSE 102* 107* 106* 103*  BUN 111* 113* 39* 16  CREATININE >18.00* 19.59* 9.82* 5.45*  CALCIUM  --  9.0 8.3* 7.9*  PHOS  --  10.3*  --   --    Liver Function Tests:  Recent Labs Lab 07/03/16 1805  ALBUMIN 3.4*   No results for input(s): LIPASE, AMYLASE in the last 168 hours. No results for input(s): AMMONIA in the last 168 hours. CBC:  Recent Labs Lab 07/03/16 0609 07/03/16 0621 07/03/16 1807 07/04/16 0604  WBC 9.2  --  9.2 6.8  NEUTROABS 7.8*  --   --   --   HGB 7.7* 7.5* 7.4* 8.7*  HCT 21.7* 22.0* 21.8* 25.2*  MCV 87.5  --  87.2 87.8  PLT 137*  --  164 147*  Cardiac Enzymes: No results for input(s): CKTOTAL, CKMB, CKMBINDEX, TROPONINI in the last 168 hours. BNP: Invalid input(s): POCBNP CBG: No results for input(s): GLUCAP in the last 168 hours. D-Dimer No results for input(s): DDIMER in the last 72 hours. Hgb A1c No results for input(s): HGBA1C in the last 72 hours. Lipid Profile No results for input(s): CHOL, HDL, LDLCALC, TRIG, CHOLHDL, LDLDIRECT in the last 72 hours. Thyroid function studies No results for input(s): TSH, T4TOTAL, T3FREE, THYROIDAB in the last 72 hours.  Invalid input(s): FREET3 Anemia work up  Recent Labs  07/03/16 1805 07/03/16 1806 07/03/16 1807  VITAMINB12 507  --   --   FOLATE  --  25.0  --   FERRITIN 305  --   --   TIBC 196*  --   --   IRON 40*  --   --   RETICCTPCT  --   --  1.1   Urinalysis No results found for: COLORURINE, APPEARANCEUR, [place], [place], GLUCOSEU, [place], BILIRUBINUR, KETONESUR, PROTEINUR, UROBILINOGEN, NITRITE, LEUKOCYTESUR Sepsis Labs Invalid input(s): PROCALCITONIN,  WBC,  LACTICIDVEN Microbiology Recent Results (from the past 240 hour(s))  MRSA PCR  Screening     Status: None   Collection Time: 07/04/16  5:21 AM  Result Value Ref Range Status   MRSA by PCR NEGATIVE NEGATIVE Final    Comment:        The GeneXpert MRSA Assay (FDA approved for NASAL specimens only), is one component of a comprehensive MRSA colonization surveillance program. It is not intended to diagnose MRSA infection nor to guide or monitor treatment for MRSA infections.      Time coordinating discharge: 45 minutes  SIGNED:   Tawni Millers, MD  [facility] 07/05/2016, 11:19 AM Pager   If 7PM-7AM, please contact night-coverage [email or web address] Password [email or web address]

## 2016-07-05 NOTE — Progress Notes (Signed)
Patient ID: Nicholas Roach, male   DOB: 02/05/1951, 65 y.o.   MRN: [id number]  [facility] Progress Note   Assessment/ Plan:   1. Respiratory distress from acute pulmonary edema: Secondary to noncompliance with hemodialysis/volume overload and polysubstance use. Improved with hemodialysis treatments and discussed compliance. Also discussed with him to follow-up with his providers addressing his history of bronchogenic carcinoma. 2. ESRD: Status post acute hemodialysis treatments on Thursday/Friday-discussed compliance upon discharge. 3. Anemia: Hemoglobin low, status post ESA-discussed compliance with ongoing dialysis of that ESA therapy can be delivered regularly. 4. CKD-MBD: Poor compliance with binder/low phosphorus diet-discussed low phosphorus intake and adherence to calcium acetate prescription. 5. Nutrition: Continue monitoring with outpatient labs-likely low albumin from under dialysis/poor oral intake 6. Hypertension: Improved blood pressure status post resumption of antihypertensive therapy/ultrafiltration  Subjective:   Reports to be feeling better and denies any chest pain or shortness of breath. Discussed adherence with dialysis.    Objective:   BP 135/69 (BP Location: Right Arm)   Pulse 90   Temp 98.6 F (37 C) (Oral)   Resp 18   Ht '5\' 11"'$  (1.803 m)   Wt 67.8 kg (149 lb 7.6 oz)   SpO2 98%   BMI 20.85 kg/m   Physical Exam: Gen:  comfortably resting in bed, watching television CVS: Pulse regular rhythm, normal rate  Resp: Anteriorly clear to auscultation, no rales/rhonchi   Abd: Soft, flat, nontender  Ext: No lower extremity edema, left radiocephalic fistula with palpable thrill/bruit   Labs: BMET  Recent Labs Lab 07/03/16 0621 07/03/16 1805 07/04/16 0604 07/05/16 0614  NA 138 138 139 134*  K 5.1 5.1 4.1 3.5  CL 100* 99* 97* 94*  CO2  --  20* 29 31  GLUCOSE 102* 107* 106* 103*  BUN 111* 113* 39* 16  CREATININE >18.00* 19.59* 9.82* 5.45*   CALCIUM  --  9.0 8.3* 7.9*  PHOS  --  10.3*  --   --    CBC  Recent Labs Lab 07/03/16 0609 07/03/16 0621 07/03/16 1807 07/04/16 0604  WBC 9.2  --  9.2 6.8  NEUTROABS 7.8*  --   --   --   HGB 7.7* 7.5* 7.4* 8.7*  HCT 21.7* 22.0* 21.8* 25.2*  MCV 87.5  --  87.2 87.8  PLT 137*  --  164 147*   Medications:    . sodium chloride   Intravenous Once  . sodium chloride   Intravenous Once  . amLODipine  10 mg Oral Daily  . calcitRIOL  0.5 mcg Oral Q M,W,F-1800  . calcium acetate  1,334 mg Oral TID WC  . carvedilol  12.5 mg Oral BID WC  . [START ON 07/10/2016] darbepoetin (ARANESP) injection - DIALYSIS  200 mcg Intravenous Q Thu-HD  . doxazosin  8 mg Oral Daily  . folic acid  1 mg Oral Daily  . multivitamin with minerals  1 tablet Oral Daily  . nitroGLYCERIN  1 inch Topical Q6H  . sodium chloride flush  3 mL Intravenous Q12H  . thiamine  100 mg Oral Daily   Or  . thiamine  100 mg Intravenous Daily   Elmarie Shiley, MD 07/05/2016, 10:25 AM

## 2016-07-05 NOTE — ED Triage Notes (Signed)
Per EMS, pt stepped out of a van and experienced left hip pain. Pt able to move left leg freely. Given 19mg fenanyl. BP-170/100, HR-90, SpO2-98% RA

## 2016-07-05 NOTE — ED Notes (Signed)
Pt returned from x-ray because he was unable to lay flat. EDP aware.

## 2016-07-06 ENCOUNTER — Emergency Department (HOSPITAL_COMMUNITY): Payer: Non-veteran care

## 2016-07-06 MED ORDER — HYDROMORPHONE HCL 2 MG/ML IJ SOLN
1.0000 mg | INTRAMUSCULAR | Status: DC | PRN
Start: 2016-07-06 — End: 2016-07-06
  Administered 2016-07-06: 1 mg via INTRAVENOUS
  Filled 2016-07-06: qty 1

## 2016-07-06 MED ORDER — DIPHENHYDRAMINE HCL 50 MG/ML IJ SOLN
25.0000 mg | Freq: Once | INTRAMUSCULAR | Status: AC
  Administered 2016-07-06: 25 mg via INTRAVENOUS
  Filled 2016-07-06: qty 1

## 2016-07-06 MED ORDER — LORAZEPAM 2 MG/ML IJ SOLN
1.0000 mg | Freq: Once | INTRAMUSCULAR | Status: AC
  Administered 2016-07-06: 1 mg via INTRAVENOUS
  Filled 2016-07-06: qty 1

## 2016-07-06 MED ORDER — OXYCODONE-ACETAMINOPHEN 5-325 MG PO TABS: 1.0000 | ORAL_TABLET | ORAL | 0 refills | Status: DC | PRN

## 2016-07-06 MED ORDER — PREDNISONE 10 MG (21) PO TBPK: 10.0000 mg | ORAL_TABLET | Freq: Every day | ORAL | 0 refills | Status: DC

## 2016-07-06 MED ORDER — METHYLPREDNISOLONE SODIUM SUCC 125 MG IJ SOLR
125.0000 mg | Freq: Once | INTRAMUSCULAR | Status: AC
  Administered 2016-07-06: 125 mg via INTRAVENOUS
  Filled 2016-07-06: qty 2

## 2016-07-06 NOTE — ED Provider Notes (Signed)
Assumed care from night shift.  Patient was seen yesterday evening for low back and left hip pain.  He underwent MRI which showed some new disc protrusion with compression on L4 nerve root and some left foraminal stenosis.  This is likely responsible for his symptoms.  Patient was treated here with IV steroids and pain medications.  He was able to ambulate in the room with myself and RN.  he has no focal neurologic deficits.  He will be discharged home with steroid taper, pain medication, and neurosurgery follow-up.  Case management has also met with him prior to discharge to discharge as patient is homeless, he plans to go to the local shelter.  Patient was given bus pass.  Mr Lumbar Spine Wo Contrast  Result Date: 07/06/2016 CLINICAL DATA:  Initial evaluation for low back pain and left hip pain. EXAM: MRI LUMBAR SPINE WITHOUT CONTRAST TECHNIQUE: Multiplanar, multisequence MR imaging of the lumbar spine was performed. No intravenous contrast was administered. COMPARISON:  Prior MRI from 12/27/2014. FINDINGS: Segmentation: Normal segmentation. Lowest well-formed disc is labeled the L5-S1 level. Alignment: Mild levoscoliosis. Vertebral bodies otherwise normally aligned with preservation of the normal lumbar lordosis. No listhesis. Vertebrae: Mild chronic height loss at the superior endplate of Y65, stable. No evidence for acute fracture. Signal intensity within the vertebral body bone marrow diffusely decreased on T1 signal intensity and somewhat heterogeneous, likely related to history of end-stage renal disease. No concerning osseous lesions. Conus medullaris: Extends to the L1-2 level and appears normal. Paraspinal and other soft tissues: Paraspinous soft tissues demonstrate no acute abnormality. Kidneys are atrophic with innumerable cysts, compatible with history of end-stage renal disease. Intra- abdominal aorta is tortuous. Disc levels: Mild diffuse congenital shortening of the pedicles. L1-2:  Negative.  L2-3: Diffuse degenerative disc bulge with disc desiccation and intervertebral disc space narrowing. Right far lateral disc protrusion closely approximates the exiting right L2 nerve root (series 6, image 16). This is stable from prior. Superimposed mild facet hypertrophy. No significant canal stenosis. Mild right foraminal narrowing stable. L3-4: Diffuse degenerative disc bulge with disc desiccation and intervertebral disc space narrowing. No focal disc protrusion. Mild facet hypertrophy. No significant canal stenosis. Mild left foraminal narrowing, stable. L4-5: Diffuse degenerative disc bulge with disc desiccation. There is a broad left foraminal/extraforaminal disc protrusion, not definite seen on previous exam (series 6, image 27). Worsened left foraminal stenosis, now moderate to severe in nature (series 4, image 11). Right neural foramen remains patent. Mild left lateral recess stenosis without significant canal narrowing. Superimposed mild bilateral facet arthrosis, left greater than right, with ligamentum flavum hypertrophy. No significant canal stenosis. L5-S1: Broad right subarticular disc protrusion extending into the right lateral recess, closely approximating but not compressing or displacing the right S1 nerve root, similar to prior. No significant canal stenosis. Mild to moderate bilateral foraminal narrowing is unchanged. IMPRESSION: 1. Broad left foraminal/far lateral disc protrusion at L4-5, contacting the transiting left L4 nerve root. This is new from prior study. Moderate to severe left foraminal stenosis at this level has worsened. 2. Broad right subarticular disc protrusion at L5-S1, closely approximating the right S1 nerve root without frank neural impingement, stable. 3. Chronic right extraforaminal disc protrusion at L2-3, closely approximating the transiting right L2 nerve root, stable. Electronically Signed   By: Jeannine Boga M.D.   On: 07/06/2016 05:59   Dg Hip Unilat W Or Wo  Pelvis 2-3 Views Left  Result Date: 07/05/2016 CLINICAL DATA:  Left hip pain for 1 day.  EXAM: DG HIP (WITH OR WITHOUT PELVIS) 2-3V LEFT COMPARISON:  None. FINDINGS: The cortical margins of the bony pelvis and left hip are intact. No fracture. Pubic symphysis and sacroiliac joints are congruent. Both femoral heads are well-seated in the respective acetabula. Mild bilateral hip degenerative change. Vascular calcifications are seen. IMPRESSION: Negative for acute fracture or dislocation of the left hip. Electronically Signed   By: Jeb Levering M.D.   On: 07/05/2016 23:34     Larene Pickett, PA-C 07/06/16 [place], MD 07/07/16 1106

## 2016-07-06 NOTE — ED Notes (Signed)
Pt remains too drowsy for d/c at this time.

## 2016-07-06 NOTE — Discharge Instructions (Signed)
Take the prescribed medication as directed. Follow-up with Dr. Cyndy Freeze (neurosurgeon) about your back. Return to the ED for new or worsening symptoms.

## 2016-07-06 NOTE — ED Notes (Signed)
Pt.went to MRI SCAN.

## 2016-07-06 NOTE — ED Notes (Signed)
MRI called and pt unable to complete MRI due to inability to lay flat due to pain

## 2016-07-06 NOTE — ED Notes (Signed)
SW at bedside.

## 2016-07-06 NOTE — ED Notes (Signed)
Pt refusing to sign d/c paperwork, states "I cannot walk, how can you discharge me?" Pt is now ambulatory in room. Pt c/o pain while ambulating, explained this is part of having disc issues in lower back and explained pt needs to follow-up outpatient with neurosurgery as this is chronic issue pt is followed for. Pt still refusing to sign d/c paperwork. Reviewed prescriptions with pt and return instructions.

## 2016-07-06 NOTE — ED Notes (Signed)
Pt states the is unable to get MRI due to pain, pt is currently itching EDP at bedside and made aware

## 2016-07-06 NOTE — Care Management Note (Addendum)
Case Management Note  Patient Details  Name: Nicholas Roach MRN: [id number] Date of Birth: [birth year]  Subjective/Objective:    65 y.o. M readmitted to the ED for SOB after recent [facility] admission with discharge 07/05/2016. He was seen by CM while there and referred back to the [place] where his ESRD is managed.  Chart reflects he is followed by Nephrology at [facility] in [place], also reflects he receives assist with medications through [place] as well and he confirms this. Return visit with Nephrology Clinic "next week". No further CM needs at present.                  Action/Plan: Discharge with follow up at [facility] clinic in [place]   Expected Discharge Date:                  Expected Discharge Plan:  Homeless Shelter  In-House Referral:  Clinical Social Work  Discharge planning Services  CM Consult  Post Acute Care Choice:  NA Choice offered to:  Patient  DME Arranged:  Other see comment Publishing copy) DME Agency:     HH Arranged:  NA HH Agency:   Publishing copy)  Status of Service:  Completed, signed off  If discussed at [employer], dates discussed:    Additional Comments:  Delrae Sawyers, RN 07/06/2016, 9:24 AM

## 2016-07-06 NOTE — ED Notes (Signed)
Pt assisted to lobby in wheelchair. Pt ambulatory with steady gait once outside ED. Pt given bus pass. No further questions/concerns at this time.

## 2016-07-06 NOTE — ED Notes (Signed)
Pt transported to MRI 

## 2016-07-06 NOTE — ED Notes (Signed)
Patient transported to MRI 

## 2017-01-29 ENCOUNTER — Encounter (HOSPITAL_COMMUNITY): Payer: Self-pay

## 2017-01-29 ENCOUNTER — Emergency Department (HOSPITAL_COMMUNITY)
Admission: EM | Admit: 2017-01-29 | Discharge: 2017-01-29 | Payer: Non-veteran care | Attending: Emergency Medicine | Admitting: Emergency Medicine

## 2017-01-29 DIAGNOSIS — Z79899 Other long term (current) drug therapy: Secondary | ICD-10-CM | POA: Insufficient documentation

## 2017-01-29 DIAGNOSIS — N183 Chronic kidney disease, stage 3 (moderate): Secondary | ICD-10-CM | POA: Diagnosis not present

## 2017-01-29 DIAGNOSIS — I129 Hypertensive chronic kidney disease with stage 1 through stage 4 chronic kidney disease, or unspecified chronic kidney disease: Secondary | ICD-10-CM | POA: Insufficient documentation

## 2017-01-29 DIAGNOSIS — Z9115 Patient's noncompliance with renal dialysis: Secondary | ICD-10-CM | POA: Diagnosis not present

## 2017-01-29 DIAGNOSIS — F17210 Nicotine dependence, cigarettes, uncomplicated: Secondary | ICD-10-CM | POA: Insufficient documentation

## 2017-01-29 DIAGNOSIS — B1920 Unspecified viral hepatitis C without hepatic coma: Secondary | ICD-10-CM | POA: Insufficient documentation

## 2017-01-29 DIAGNOSIS — Z992 Dependence on renal dialysis: Secondary | ICD-10-CM

## 2017-01-29 LAB — CBC WITH DIFFERENTIAL/PLATELET
Basophils Absolute: 0 10*3/uL (ref 0.0–0.1)
Basophils Relative: 1 %
Eosinophils Absolute: 0.4 10*3/uL (ref 0.0–0.7)
Eosinophils Relative: 6 %
HEMATOCRIT: 33.1 % — AB (ref 39.0–52.0)
HEMOGLOBIN: 10.5 g/dL — AB (ref 13.0–17.0)
LYMPHS ABS: 0.8 10*3/uL (ref 0.7–4.0)
LYMPHS PCT: 11 %
MCH: 28.1 pg (ref 26.0–34.0)
MCHC: 31.7 g/dL (ref 30.0–36.0)
MCV: 88.5 fL (ref 78.0–100.0)
MONO ABS: 0.8 10*3/uL (ref 0.1–1.0)
MONOS PCT: 11 %
NEUTROS ABS: 5.6 10*3/uL (ref 1.7–7.7)
NEUTROS PCT: 71 %
Platelets: 165 10*3/uL (ref 150–400)
RBC: 3.74 MIL/uL — ABNORMAL LOW (ref 4.22–5.81)
RDW: 16.8 % — ABNORMAL HIGH (ref 11.5–15.5)
WBC: 7.7 10*3/uL (ref 4.0–10.5)

## 2017-01-29 LAB — COMPREHENSIVE METABOLIC PANEL
ALBUMIN: 3.1 g/dL — AB (ref 3.5–5.0)
ALK PHOS: 68 U/L (ref 38–126)
ALT: 15 U/L — ABNORMAL LOW (ref 17–63)
AST: 29 U/L (ref 15–41)
Anion gap: 16 — ABNORMAL HIGH (ref 5–15)
BILIRUBIN TOTAL: 0.8 mg/dL (ref 0.3–1.2)
BUN: 52 mg/dL — ABNORMAL HIGH (ref 6–20)
CALCIUM: 8.9 mg/dL (ref 8.9–10.3)
CO2: 25 mmol/L (ref 22–32)
Chloride: 93 mmol/L — ABNORMAL LOW (ref 101–111)
Creatinine, Ser: 12.68 mg/dL — ABNORMAL HIGH (ref 0.61–1.24)
GFR, EST AFRICAN AMERICAN: 4 mL/min — AB (ref >60)
GFR, EST NON AFRICAN AMERICAN: 4 mL/min — AB (ref >60)
GLUCOSE: 136 mg/dL — AB (ref 65–99)
Potassium: 4.5 mmol/L (ref 3.5–5.1)
Sodium: 134 mmol/L — ABNORMAL LOW (ref 135–145)
TOTAL PROTEIN: 7.6 g/dL (ref 6.5–8.1)

## 2017-01-29 NOTE — ED Provider Notes (Signed)
[facility] DEPT Provider Note   CSN: [id number] Arrival date & time: 01/29/17  1456     History   Chief Complaint Chief Complaint  Patient presents with  . Needs Dialysis    police custody    HPI Nicholas Roach is a 66 y.o. male.  HPI   66 year old male presents in police custody desiring dialysis. Patient normally receives dialysis Tuesday, Thursday and Saturdays. He was receiving his dialysis and [place]. Last dialysis was on Tuesday. He was brought into custody, and they have not yet established care with a local dialysis center, and are coming to the emergency department to receive dialysis. Per officer's report, the surrounding centers will not accept patient for dialysis.  Patient denies any symptoms today, including no shortness of breath, no nausea, no vomiting, no fevers, no abdominal pain, no chest pain.  Past Medical History:  Diagnosis Date  . CKD (chronic kidney disease) stage 3, GFR 30-59 ml/min   . Hepatitis C   . Hypertension   . Renal disorder   . Renal insufficiency     Patient Active Problem List   Diagnosis Date Noted  . Pulmonary edema 07/03/2016  . Acute pulmonary edema ([place]) 07/03/2016  . Hyperkalemia 07/03/2016  . Normocytic anemia 05/21/2016  . Melena 05/21/2016  . ESRD (end stage renal disease) on dialysis ([place]) 05/21/2016  . 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 Hepatitis C 05/21/2016  . Hemoptysis 05/21/2016    Past Surgical History:  Procedure Laterality Date  . FOOT FRACTURE SURGERY Left        Home Medications    Prior to Admission medications   Medication Sig Start Date End Date Taking? Authorizing Provider  amLODipine (NORVASC) 10 MG tablet Take 10 mg by mouth daily.    [provider]  calcitRIOL (ROCALTROL) 0.5 MCG capsule Take 1 capsule (0.5 mcg total) by mouth every Monday, Wednesday, and Friday at 6 PM. 07/07/16   Arrien, Jimmy Picket, MD  calcium acetate (PHOSLO) 667 MG capsule Take 1,334 mg by  mouth 3 (three) times daily with meals.    [provider]  carvedilol (COREG) 12.5 MG tablet Take 12.5 mg by mouth 2 (two) times daily with a meal.    [provider]  doxazosin (CARDURA) 8 MG tablet Take 8 mg by mouth daily.    [provider]  oxyCODONE-acetaminophen (PERCOCET/ROXICET) 5-325 MG tablet Take 1 tablet by mouth every 4 (four) hours as needed. 07/06/16   Larene Pickett, PA-C  predniSONE (STERAPRED UNI-PAK 21 TAB) 10 MG (21) TBPK tablet Take 1 tablet (10 mg total) by mouth daily. Take 6 tabs by mouth daily  for 2 days, then 5 tabs for 2 days, then 4 tabs for 2 days, then 3 tabs for 2 days, 2 tabs for 2 days, then 1 tab by mouth daily for 2 days 07/06/16   Larene Pickett, PA-C    Family History Family History  Problem Relation Age of Onset  . Hypertension Mother   . Heart Problems Mother     Social History Social History  Substance Use Topics  . Smoking status: Current Every Day Smoker  . Smokeless tobacco: Never Used  . Alcohol use Yes     Comment: Occasionally     Allergies   Patient has no known allergies.   Review of Systems Review of Systems  Constitutional: Negative for fever.  Eyes: Negative for visual disturbance.  Respiratory: Negative for shortness of breath.   Cardiovascular: Negative for chest pain.  Gastrointestinal: Negative for abdominal pain, nausea and vomiting.  Genitourinary: Positive for difficulty urinating (does not make urine).  Skin: Positive for rash (3 days of pruritic rash).  Neurological: Negative for syncope and headaches.     Physical Exam Updated Vital Signs BP (!) 164/105   Pulse (!) 101   Temp 98.4 F (36.9 C) (Oral)   Resp 16   Ht 5\' 10"  (1.778 m)   Wt 71.2 kg (156 lb 15.5 oz)   SpO2 99%   BMI 22.52 kg/m   Physical Exam  Constitutional: He is oriented to person, place, and time. He appears well-developed and well-nourished. No distress.  HENT:  Head: Normocephalic and atraumatic.    Eyes: Conjunctivae and EOM are normal.  Neck: Normal range of motion.  Cardiovascular: Normal rate, regular rhythm, normal heart sounds and intact distal pulses.  Exam reveals no gallop and no friction rub.   No murmur heard. Pulmonary/Chest: Effort normal and breath sounds normal. No respiratory distress. He has no wheezes. He has no rales.  Abdominal: Soft. He exhibits no distension. There is no tenderness. There is no guarding.  Musculoskeletal: He exhibits no edema.  Neurological: He is alert and oriented to person, place, and time.  Skin: Skin is warm and dry. Rash (small papules present over back, arms) noted. He is not diaphoretic.  Nursing note and vitals reviewed.    ED Treatments / Results  Labs (all labs ordered are listed, but only abnormal results are displayed) Labs Reviewed  CBC WITH DIFFERENTIAL/PLATELET - Abnormal; Notable for the following:       Result Value   RBC 3.74 (*)    Hemoglobin 10.5 (*)    HCT 33.1 (*)    RDW 16.8 (*)    All other components within normal limits  COMPREHENSIVE METABOLIC PANEL - Abnormal; Notable for the following:    Sodium 134 (*)    Chloride 93 (*)    Glucose, Bld 136 (*)    BUN 52 (*)    Creatinine, Ser 12.68 (*)    Albumin 3.1 (*)    ALT 15 (*)    GFR calc non Af Amer 4 (*)    GFR calc Af Amer 4 (*)    Anion gap 16 (*)    All other components within normal limits    EKG  EKG Interpretation None       Radiology No results found.  Procedures Procedures (including critical care time)  Medications Ordered in ED Medications - No data to display   Initial Impression / Assessment and Plan / ED Course  I have reviewed the triage vital signs and the nursing notes.  Pertinent labs & imaging results that were available during my care of the patient were reviewed by me and considered in my medical decision making (see chart for details).     66 year old male with a history of ESRD on dialysis Tuesday-Thursday to  Saturday in [place], presents in police, custody from [facility] with desire for dialysis. Patient was last dialyzed on Tuesday. He has no symptoms to suggest volume overload, is not hypoxic, not tachypneic, and has normal potassium on labs. There is no indication for emergent dialysis at this time.  Pt also reports rash, appears most consistent with heat rash. May use benadryl for pruritis.  Discussed patient's care with Dr. Moshe Cipro, who is the nephrologist on call.  She reports we're unable to dialyze him tonight, however would be able to dialyze him in the morning or  early afternoon tomorrow. She will call the medical facility at the jail, to help organize this, and in addition they will work on establishing outpatient dialysis facility.    However, FOR TOMORROW 7/6, she recommends patient RE-PRESENT to the ED for dialysis. Officer believes he might be able to bring him at noon.  Discussed with Agricultural consultant. Plan will be immediate call to Dialysis and [facility] by medical provider on patient's arrival tomorrow to expedite patient receiving dialysis.  Dialysis will be expecting him.      Final Clinical Impressions(s) / ED Diagnoses   Final diagnoses:  Dialysis patient [facility])    New Prescriptions Discharge Medication List as of 01/29/2017  7:26 PM       Gareth Morgan, MD 01/30/17 [phone]

## 2017-01-29 NOTE — ED Triage Notes (Signed)
Pt arrives in police custody stating that he is here for dialysis. Last dialysis was Tuesday without complications. He was placed in police custody yesterday so he was brought to the ER to get his dialysis. He denies chest pain or SOB. Dialysis T-Thur-Sat. Pt has shackles on and hius hand cuffs were removed by the police officer.

## 2017-01-29 NOTE — ED Notes (Signed)
Pt reports he does not produce urine.

## 2017-01-30 ENCOUNTER — Encounter (HOSPITAL_COMMUNITY): Payer: Self-pay | Admitting: *Deleted

## 2017-01-30 ENCOUNTER — Non-Acute Institutional Stay (HOSPITAL_COMMUNITY)
Admission: EM | Admit: 2017-01-30 | Discharge: 2017-01-30 | Payer: Non-veteran care | Attending: Emergency Medicine | Admitting: Emergency Medicine

## 2017-01-30 DIAGNOSIS — I120 Hypertensive chronic kidney disease with stage 5 chronic kidney disease or end stage renal disease: Secondary | ICD-10-CM | POA: Diagnosis present

## 2017-01-30 DIAGNOSIS — Z992 Dependence on renal dialysis: Secondary | ICD-10-CM | POA: Insufficient documentation

## 2017-01-30 DIAGNOSIS — B1710 Acute hepatitis C without hepatic coma: Secondary | ICD-10-CM | POA: Diagnosis not present

## 2017-01-30 DIAGNOSIS — Z79899 Other long term (current) drug therapy: Secondary | ICD-10-CM | POA: Diagnosis not present

## 2017-01-30 DIAGNOSIS — N186 End stage renal disease: Secondary | ICD-10-CM | POA: Insufficient documentation

## 2017-01-30 DIAGNOSIS — F17200 Nicotine dependence, unspecified, uncomplicated: Secondary | ICD-10-CM | POA: Diagnosis not present

## 2017-01-30 LAB — RENAL FUNCTION PANEL
Albumin: 3.1 g/dL — ABNORMAL LOW (ref 3.5–5.0)
Anion gap: 14 (ref 5–15)
BUN: 63 mg/dL — ABNORMAL HIGH (ref 6–20)
CHLORIDE: 97 mmol/L — AB (ref 101–111)
CO2: 23 mmol/L (ref 22–32)
Calcium: 8.7 mg/dL — ABNORMAL LOW (ref 8.9–10.3)
Creatinine, Ser: 14.04 mg/dL — ABNORMAL HIGH (ref 0.61–1.24)
GFR, EST AFRICAN AMERICAN: 4 mL/min — AB (ref >60)
GFR, EST NON AFRICAN AMERICAN: 3 mL/min — AB (ref >60)
Glucose, Bld: 94 mg/dL (ref 65–99)
POTASSIUM: 4.8 mmol/L (ref 3.5–5.1)
Phosphorus: 9.8 mg/dL — ABNORMAL HIGH (ref 2.5–4.6)
Sodium: 134 mmol/L — ABNORMAL LOW (ref 135–145)

## 2017-01-30 MED ORDER — LIDOCAINE-PRILOCAINE 2.5-2.5 % EX CREA
1.0000 "application " | TOPICAL_CREAM | CUTANEOUS | Status: DC | PRN
  Filled 2017-01-30: qty 5

## 2017-01-30 MED ORDER — SODIUM CHLORIDE 0.9 % IV SOLN: 100.0000 mL | INTRAVENOUS | Status: DC | PRN

## 2017-01-30 MED ORDER — ALTEPLASE 2 MG IJ SOLR: 2.0000 mg | Freq: Once | INTRAMUSCULAR | Status: DC | PRN

## 2017-01-30 MED ORDER — HEPARIN SODIUM (PORCINE) 1000 UNIT/ML DIALYSIS
20.0000 [IU]/kg | INTRAMUSCULAR | Status: DC | PRN
  Filled 2017-01-30: qty 2

## 2017-01-30 MED ORDER — PENTAFLUOROPROP-TETRAFLUOROETH EX AERO
1.0000 "application " | INHALATION_SPRAY | CUTANEOUS | Status: DC | PRN
  Filled 2017-01-30: qty 30

## 2017-01-30 MED ORDER — LIDOCAINE HCL (PF) 1 % IJ SOLN: 5.0000 mL | INTRAMUSCULAR | Status: DC | PRN

## 2017-01-30 MED ORDER — HEPARIN SODIUM (PORCINE) 1000 UNIT/ML DIALYSIS
1000.0000 [IU] | INTRAMUSCULAR | Status: DC | PRN
  Filled 2017-01-30: qty 1

## 2017-01-30 NOTE — Progress Notes (Signed)
Patient arrived to unit per [facility] from ED.  Reviewed treatment plan and this RN agrees.  Report received from bedside RN, Millie.  Consent obatined.  Patient A & O X 4. Lung sounds clear to ausculation in all fields. No edema. Cardiac: NSR.  Prepped LLAVF with alcohol and cannulated with two 15 gauge needles.  Pulsation of blood noted.  Flushed access well with saline per protocol.  Connected and secured lines and initiated tx at 1014.  UF goal of 4500 mL and net fluid removal of 4000 mL.  Will continue to monitor.

## 2017-01-30 NOTE — ED Triage Notes (Signed)
Pt in police custody requesting HD, pt last rcvd HD on Tuesday, pt here yesterday for same & did not receive HD, pt A&O x4, denies pain and SOB

## 2017-01-30 NOTE — ED Provider Notes (Signed)
[facility] DEPT Provider Note   CSN: [id number] Arrival date & time: 01/30/17  4742     History   Chief Complaint Chief Complaint  Patient presents with  . Vascular Access Problem    HPI Nicholas Roach is a 66 y.o. male.  HPI Patient presents today for hemodialysis. Last dialyzed Tuesday. Normally dialyzed every Tuesday, Thursday, Saturday. Has had mild increased cough and shortness of breath. Denies any other symptoms. Past Medical History:  Diagnosis Date  . CKD (chronic kidney disease) stage 3, GFR 30-59 ml/min   . Hepatitis C   . Hypertension   . Renal disorder   . Renal insufficiency     Patient Active Problem List   Diagnosis Date Noted  . Pulmonary edema 07/03/2016  . Acute pulmonary edema ([place]) 07/03/2016  . Hyperkalemia 07/03/2016  . Normocytic anemia 05/21/2016  . Melena 05/21/2016  . ESRD (end stage renal disease) on dialysis ([place]) 05/21/2016  . 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 Hepatitis C 05/21/2016  . Hemoptysis 05/21/2016    Past Surgical History:  Procedure Laterality Date  . FOOT FRACTURE SURGERY Left        Home Medications    Prior to Admission medications   Medication Sig Start Date End Date Taking? Authorizing Provider  amLODipine (NORVASC) 10 MG tablet Take 10 mg by mouth daily.    [provider]  calcitRIOL (ROCALTROL) 0.5 MCG capsule Take 1 capsule (0.5 mcg total) by mouth every Monday, Wednesday, and Friday at 6 PM. 07/07/16   Arrien, Jimmy Picket, MD  calcium acetate (PHOSLO) 667 MG capsule Take 1,334 mg by mouth 3 (three) times daily with meals.    [provider]  carvedilol (COREG) 12.5 MG tablet Take 12.5 mg by mouth 2 (two) times daily with a meal.    [provider]  doxazosin (CARDURA) 8 MG tablet Take 8 mg by mouth daily.    [provider]  oxyCODONE-acetaminophen (PERCOCET/ROXICET) 5-325 MG tablet Take 1 tablet by mouth every 4 (four) hours as needed. 07/06/16   Larene Pickett, PA-C  predniSONE (STERAPRED UNI-PAK 21 TAB) 10 MG (21) TBPK tablet Take 1 tablet (10 mg total) by mouth daily. Take 6 tabs by mouth daily  for 2 days, then 5 tabs for 2 days, then 4 tabs for 2 days, then 3 tabs for 2 days, 2 tabs for 2 days, then 1 tab by mouth daily for 2 days 07/06/16   Larene Pickett, PA-C    Family History Family History  Problem Relation Age of Onset  . Hypertension Mother   . Heart Problems Mother     Social History Social History  Substance Use Topics  . Smoking status: Current Every Day Smoker  . Smokeless tobacco: Never Used  . Alcohol use Yes     Comment: Occasionally     Allergies   Patient has no known allergies.   Review of Systems Review of Systems  Constitutional: Negative for chills and fever.  Respiratory: Positive for cough and shortness of breath.   Cardiovascular: Negative for chest pain, palpitations and leg swelling.  Gastrointestinal: Negative for abdominal pain, constipation, diarrhea, nausea and vomiting.  Musculoskeletal: Negative for back pain, myalgias and neck pain.  Neurological: Negative for weakness and numbness.  All other systems reviewed and are negative.    Physical Exam Updated Vital Signs BP (!) 155/102   Pulse 96   Temp 98 F (36.7 C)   Resp 20   Ht 5\' 10"  (1.778  m)   Wt 70.5 kg (155 lb 6.8 oz)   SpO2 100%   BMI 22.30 kg/m   Physical Exam  Constitutional: He is oriented to person, place, and time. He appears well-developed and well-nourished.  HENT:  Head: Normocephalic and atraumatic.  Mouth/Throat: Oropharynx is clear and moist.  Eyes: EOM are normal. Pupils are equal, round, and reactive to light.  Neck: Normal range of motion. Neck supple.  Cardiovascular: Normal rate and regular rhythm.   Pulmonary/Chest: Effort normal and breath sounds normal.  Few crackles in bilateral bases  Abdominal: Soft. Bowel sounds are normal. There is no tenderness. There is no rebound and no guarding.    Musculoskeletal: Normal range of motion. He exhibits no edema or tenderness.  Left forearm dialysis fistula with palpable thrill.  Neurological: He is alert and oriented to person, place, and time.  Moving all extremities without focal deficit. Sensation intact.  Skin: Skin is warm and dry. No rash noted. No erythema.  Psychiatric: He has a normal mood and affect. His behavior is normal.  Nursing note and vitals reviewed.    ED Treatments / Results  Labs (all labs ordered are listed, but only abnormal results are displayed) Labs Reviewed  RENAL FUNCTION PANEL - Abnormal; Notable for the following:       Result Value   Sodium 134 (*)    Chloride 97 (*)    BUN 63 (*)    Creatinine, Ser 14.04 (*)    Calcium 8.7 (*)    Phosphorus 9.8 (*)    Albumin 3.1 (*)    GFR calc non Af Amer 3 (*)    GFR calc Af Amer 4 (*)    All other components within normal limits  HEPATITIS B SURFACE ANTIBODY  HEPATITIS B SURFACE ANTIGEN  HEPATITIS B CORE ANTIBODY, TOTAL    EKG  EKG Interpretation None       Radiology No results found.  Procedures Procedures (including critical care time)  Medications Ordered in ED Medications - No data to display   Initial Impression / Assessment and Plan / ED Course  I have reviewed the triage vital signs and the nursing notes.  Pertinent labs & imaging results that were available during my care of the patient were reviewed by me and considered in my medical decision making (see chart for details).     Taken up to dialysis.  Final Clinical Impressions(s) / ED Diagnoses   Final diagnoses:  None    New Prescriptions Discharge Medication List as of 01/30/2017  2:51 PM       Julianne Rice, MD 02/01/17 [phone]

## 2017-01-30 NOTE — Progress Notes (Signed)
Dialysis treatment completed.  4500 mL ultrafiltrated and net fluid removal 4000 mL.    Patient status unchanged. Lung sounds clear to ausculation in all fields. No edema. Cardiac: NSR.  Disconnected lines and removed needles.  Pressure held for 10 minutes and band aid/gauze dressing applied.  Patient returned to custody of [place] post tx.Marland Kitchen

## 2017-01-30 NOTE — ED Notes (Signed)
Pt to HD via [place] with guards.

## 2017-01-31 ENCOUNTER — Encounter (HOSPITAL_COMMUNITY): Payer: Self-pay | Admitting: Emergency Medicine

## 2017-01-31 ENCOUNTER — Emergency Department (HOSPITAL_COMMUNITY)
Admission: EM | Admit: 2017-01-31 | Discharge: 2017-01-31 | Disposition: A | Discharge status: Home / Self Care | Payer: Non-veteran care | Attending: Emergency Medicine | Admitting: Emergency Medicine

## 2017-01-31 DIAGNOSIS — F17200 Nicotine dependence, unspecified, uncomplicated: Secondary | ICD-10-CM | POA: Diagnosis not present

## 2017-01-31 DIAGNOSIS — Z79899 Other long term (current) drug therapy: Secondary | ICD-10-CM | POA: Diagnosis not present

## 2017-01-31 DIAGNOSIS — I120 Hypertensive chronic kidney disease with stage 5 chronic kidney disease or end stage renal disease: Secondary | ICD-10-CM | POA: Insufficient documentation

## 2017-01-31 DIAGNOSIS — N186 End stage renal disease: Secondary | ICD-10-CM | POA: Insufficient documentation

## 2017-01-31 DIAGNOSIS — Z992 Dependence on renal dialysis: Secondary | ICD-10-CM | POA: Insufficient documentation

## 2017-01-31 LAB — HEPATITIS B SURFACE ANTIGEN: HEP B S AG: NEGATIVE

## 2017-01-31 LAB — HEPATITIS B CORE ANTIBODY, TOTAL: HEP B C TOTAL AB: NEGATIVE

## 2017-01-31 LAB — HEPATITIS B SURFACE ANTIBODY,QUALITATIVE: HEP B S AB: REACTIVE

## 2017-01-31 NOTE — ED Provider Notes (Signed)
[facility] DEPT Provider Note   CSN: [id number] Arrival date & time: 01/31/17  2863     History   Chief Complaint No chief complaint on file.   HPI Nicholas Roach is a 66 y.o. male.  Pt reports he has ESRD.  Pt is on hemodialysis t.th and Sat.   Pt is currently in the [place] [facility].  Pt does not have a current dialysis center.  Accompanying police officer reports Nicholas Roach is working on getting pt scheduled for regular dialyis center.  Pt had dialysis  here yesterday.  Pt reports he was dropped below normal dry weight.  Pt denies feeling short of breath.     The history is provided by the patient. No language interpreter was used.    Past Medical History:  Diagnosis Date  . CKD (chronic kidney disease) stage 3, GFR 30-59 ml/min   . Hepatitis C   . Hypertension   . Renal disorder   . Renal insufficiency     Patient Active Problem List   Diagnosis Date Noted  . Pulmonary edema 07/03/2016  . Acute pulmonary edema ([place]) 07/03/2016  . Hyperkalemia 07/03/2016  . Normocytic anemia 05/21/2016  . Melena 05/21/2016  . ESRD (end stage renal disease) on dialysis ([place]) 05/21/2016  . 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 Hepatitis C 05/21/2016  . Hemoptysis 05/21/2016    Past Surgical History:  Procedure Laterality Date  . FOOT FRACTURE SURGERY Left        Home Medications    Prior to Admission medications   Medication Sig Start Date End Date Taking? Authorizing Provider  amLODipine (NORVASC) 10 MG tablet Take 10 mg by mouth daily.    [provider]  calcitRIOL (ROCALTROL) 0.5 MCG capsule Take 1 capsule (0.5 mcg total) by mouth every Monday, Wednesday, and Friday at 6 PM. 07/07/16   Arrien, Jimmy Picket, MD  calcium acetate (PHOSLO) 667 MG capsule Take 1,334 mg by mouth 3 (three) times daily with meals.    [provider]  carvedilol (COREG) 12.5 MG tablet Take 12.5 mg by mouth 2 (two) times daily with a meal.    [provider]    doxazosin (CARDURA) 8 MG tablet Take 8 mg by mouth daily.    [provider]  oxyCODONE-acetaminophen (PERCOCET/ROXICET) 5-325 MG tablet Take 1 tablet by mouth every 4 (four) hours as needed. 07/06/16   Larene Pickett, PA-C  predniSONE (STERAPRED UNI-PAK 21 TAB) 10 MG (21) TBPK tablet Take 1 tablet (10 mg total) by mouth daily. Take 6 tabs by mouth daily  for 2 days, then 5 tabs for 2 days, then 4 tabs for 2 days, then 3 tabs for 2 days, 2 tabs for 2 days, then 1 tab by mouth daily for 2 days 07/06/16   Larene Pickett, PA-C    Family History Family History  Problem Relation Age of Onset  . Hypertension Mother   . Heart Problems Mother     Social History Social History  Substance Use Topics  . Smoking status: Current Every Day Smoker  . Smokeless tobacco: Never Used  . Alcohol use Yes     Comment: Occasionally     Allergies   Patient has no known allergies.   Review of Systems Review of Systems  All other systems reviewed and are negative.    Physical Exam Updated Vital Signs BP (!) 148/97 (BP Location: Right Arm)   Pulse 91   Temp 98.1 F (36.7 C) (Oral)   Resp  14   SpO2 99%   Physical Exam  Constitutional: He is oriented to person, place, and time. He appears well-developed and well-nourished.  HENT:  Head: Normocephalic and atraumatic.  Eyes: EOM are normal. Pupils are equal, round, and reactive to light.  Neck: Normal range of motion.  Cardiovascular: Normal rate.   Pulmonary/Chest: Effort normal.  Abdominal: Soft.  Musculoskeletal: Normal range of motion.  Neurological: He is alert and oriented to person, place, and time.  Skin: Skin is warm.  Psychiatric: He has a normal mood and affect.  Nursing note and vitals reviewed.    ED Treatments / Results  Labs (all labs ordered are listed, but only abnormal results are displayed) Labs Reviewed - No data to display  EKG  EKG Interpretation None       Radiology No results  found.  Procedures Procedures (including critical care time)  Medications Ordered in ED Medications - No data to display   Initial Impression / Assessment and Plan / ED Course  I have reviewed the triage vital signs and the nursing notes.  Pertinent labs & imaging results that were available during my care of the patient were reviewed by me and considered in my medical decision making (see chart for details).     Consult to Nephrology Dr. Augustin Coupe.   He advised to return on Monday, Thursday and Saturday this week to get pt on regular schedule.   Final Clinical Impressions(s) / ED Diagnoses   Final diagnoses:  ESRF (end stage renal failure) ([place])    New Prescriptions New Prescriptions   No medications on file  An After Visit Summary was printed and given to the patient.   Nicholas Roach, [place] 01/31/17 7026    Nicholas Rank, MD 02/01/17 [phone]

## 2017-01-31 NOTE — Discharge Instructions (Signed)
Return here on Monday, Thursday and Saturday to get back on schedule.

## 2017-01-31 NOTE — ED Triage Notes (Signed)
Patient here for dialysis.  States he normally is dialyzed T,TH,SAT.  Missed dialysis Thursday, was dialyzed yesterday and was told to come back today to get back on his T,TH,SAT schedule.  Patient alert and oriented and I no apparent distress at this time.  Patient has no complaints at this time.  Dialysis access is in left lower forearm.

## 2017-11-11 ENCOUNTER — Inpatient Hospital Stay (HOSPITAL_COMMUNITY)
Admission: EM | Admit: 2017-11-11 | Discharge: 2017-11-18 | DRG: 189 | Disposition: A | Discharge status: Home / Self Care | Payer: Medicare Other | Attending: Internal Medicine | Admitting: Internal Medicine

## 2017-11-11 ENCOUNTER — Emergency Department (HOSPITAL_COMMUNITY): Payer: Medicare Other

## 2017-11-11 ENCOUNTER — Other Ambulatory Visit: Payer: Self-pay

## 2017-11-11 ENCOUNTER — Encounter (HOSPITAL_COMMUNITY): Payer: Self-pay

## 2017-11-11 DIAGNOSIS — I120 Hypertensive chronic kidney disease with stage 5 chronic kidney disease or end stage renal disease: Secondary | ICD-10-CM | POA: Diagnosis present

## 2017-11-11 DIAGNOSIS — Z79899 Other long term (current) drug therapy: Secondary | ICD-10-CM

## 2017-11-11 DIAGNOSIS — Z888 Allergy status to other drugs, medicaments and biological substances status: Secondary | ICD-10-CM

## 2017-11-11 DIAGNOSIS — R0902 Hypoxemia: Secondary | ICD-10-CM | POA: Diagnosis present

## 2017-11-11 DIAGNOSIS — J810 Acute pulmonary edema: Principal | ICD-10-CM | POA: Diagnosis present

## 2017-11-11 DIAGNOSIS — Z590 Homelessness: Secondary | ICD-10-CM

## 2017-11-11 DIAGNOSIS — I2510 Atherosclerotic heart disease of native coronary artery without angina pectoris: Secondary | ICD-10-CM | POA: Diagnosis present

## 2017-11-11 DIAGNOSIS — E875 Hyperkalemia: Secondary | ICD-10-CM | POA: Diagnosis present

## 2017-11-11 DIAGNOSIS — Z7902 Long term (current) use of antithrombotics/antiplatelets: Secondary | ICD-10-CM

## 2017-11-11 DIAGNOSIS — R0602 Shortness of breath: Secondary | ICD-10-CM | POA: Diagnosis not present

## 2017-11-11 DIAGNOSIS — Z992 Dependence on renal dialysis: Secondary | ICD-10-CM

## 2017-11-11 DIAGNOSIS — F17210 Nicotine dependence, cigarettes, uncomplicated: Secondary | ICD-10-CM | POA: Diagnosis present

## 2017-11-11 DIAGNOSIS — E8770 Fluid overload, unspecified: Secondary | ICD-10-CM | POA: Diagnosis present

## 2017-11-11 DIAGNOSIS — Z8249 Family history of ischemic heart disease and other diseases of the circulatory system: Secondary | ICD-10-CM

## 2017-11-11 DIAGNOSIS — N2581 Secondary hyperparathyroidism of renal origin: Secondary | ICD-10-CM | POA: Diagnosis present

## 2017-11-11 DIAGNOSIS — F1490 Cocaine use, unspecified, uncomplicated: Secondary | ICD-10-CM | POA: Diagnosis present

## 2017-11-11 DIAGNOSIS — N186 End stage renal disease: Secondary | ICD-10-CM | POA: Diagnosis present

## 2017-11-11 DIAGNOSIS — D631 Anemia in chronic kidney disease: Secondary | ICD-10-CM | POA: Diagnosis present

## 2017-11-11 DIAGNOSIS — Z951 Presence of aortocoronary bypass graft: Secondary | ICD-10-CM

## 2017-11-11 DIAGNOSIS — J811 Chronic pulmonary edema: Secondary | ICD-10-CM

## 2017-11-11 DIAGNOSIS — I150 Renovascular hypertension: Secondary | ICD-10-CM | POA: Diagnosis present

## 2017-11-11 DIAGNOSIS — Z09 Encounter for follow-up examination after completed treatment for conditions other than malignant neoplasm: Secondary | ICD-10-CM

## 2017-11-11 DIAGNOSIS — Z7982 Long term (current) use of aspirin: Secondary | ICD-10-CM

## 2017-11-11 LAB — CBC
HCT: 33.5 % — ABNORMAL LOW (ref 39.0–52.0)
Hemoglobin: 11.1 g/dL — ABNORMAL LOW (ref 13.0–17.0)
MCH: 29.9 pg (ref 26.0–34.0)
MCHC: 33.1 g/dL (ref 30.0–36.0)
MCV: 90.3 fL (ref 78.0–100.0)
PLATELETS: 148 10*3/uL — AB (ref 150–400)
RBC: 3.71 MIL/uL — ABNORMAL LOW (ref 4.22–5.81)
RDW: 15.1 % (ref 11.5–15.5)
WBC: 9.7 10*3/uL (ref 4.0–10.5)

## 2017-11-11 LAB — I-STAT TROPONIN, ED: Troponin i, poc: 0.08 ng/mL (ref 0.00–0.08)

## 2017-11-11 LAB — BASIC METABOLIC PANEL
Anion gap: 19 — ABNORMAL HIGH (ref 5–15)
BUN: 70 mg/dL — AB (ref 6–20)
CALCIUM: 8.6 mg/dL — AB (ref 8.9–10.3)
CO2: 19 mmol/L — ABNORMAL LOW (ref 22–32)
CREATININE: 13.8 mg/dL — AB (ref 0.61–1.24)
Chloride: 97 mmol/L — ABNORMAL LOW (ref 101–111)
GFR calc Af Amer: 4 mL/min — ABNORMAL LOW (ref >60)
GFR calc non Af Amer: 3 mL/min — ABNORMAL LOW (ref >60)
Glucose, Bld: 107 mg/dL — ABNORMAL HIGH (ref 65–99)
Potassium: 5.3 mmol/L — ABNORMAL HIGH (ref 3.5–5.1)
SODIUM: 135 mmol/L (ref 135–145)

## 2017-11-11 MED ORDER — NITROGLYCERIN 2 % TD OINT
1.0000 [in_us] | TOPICAL_OINTMENT | Freq: Once | TRANSDERMAL | Status: AC
  Administered 2017-11-12: 1 [in_us] via TOPICAL
  Filled 2017-11-11: qty 1

## 2017-11-11 NOTE — ED Notes (Signed)
Called Patient for vitals and had no answer.

## 2017-11-11 NOTE — ED Triage Notes (Signed)
Pt is a dialysis pt. Last full dialysis treatment Monday, pt states that when he left Monday he had almost 2 Liters of fluid that was not removed. Pt recently got out of prison and was told that he could not go back to his dialysis clinic in [place], pt did not have dialysis today and needs a new dialysis clinic. Pt complains of central chest discomfort. VSS.

## 2017-11-12 ENCOUNTER — Encounter (HOSPITAL_COMMUNITY): Payer: Self-pay | Admitting: General Practice

## 2017-11-12 DIAGNOSIS — J810 Acute pulmonary edema: Secondary | ICD-10-CM | POA: Diagnosis not present

## 2017-11-12 DIAGNOSIS — E8770 Fluid overload, unspecified: Secondary | ICD-10-CM | POA: Diagnosis present

## 2017-11-12 LAB — CBC
HEMATOCRIT: 26.9 % — AB (ref 39.0–52.0)
Hemoglobin: 8.8 g/dL — ABNORMAL LOW (ref 13.0–17.0)
MCH: 29.2 pg (ref 26.0–34.0)
MCHC: 32.7 g/dL (ref 30.0–36.0)
MCV: 89.4 fL (ref 78.0–100.0)
PLATELETS: 114 10*3/uL — AB (ref 150–400)
RBC: 3.01 MIL/uL — ABNORMAL LOW (ref 4.22–5.81)
RDW: 14.9 % (ref 11.5–15.5)
WBC: 6.8 10*3/uL (ref 4.0–10.5)

## 2017-11-12 LAB — RENAL FUNCTION PANEL
Albumin: 2.9 g/dL — ABNORMAL LOW (ref 3.5–5.0)
Anion gap: 18 — ABNORMAL HIGH (ref 5–15)
BUN: 105 mg/dL — AB (ref 6–20)
CHLORIDE: 97 mmol/L — AB (ref 101–111)
CO2: 20 mmol/L — AB (ref 22–32)
Calcium: 7.9 mg/dL — ABNORMAL LOW (ref 8.9–10.3)
Creatinine, Ser: 16.15 mg/dL — ABNORMAL HIGH (ref 0.61–1.24)
GFR calc Af Amer: 3 mL/min — ABNORMAL LOW (ref >60)
GFR, EST NON AFRICAN AMERICAN: 3 mL/min — AB (ref >60)
Glucose, Bld: 108 mg/dL — ABNORMAL HIGH (ref 65–99)
POTASSIUM: 5.2 mmol/L — AB (ref 3.5–5.1)
Phosphorus: 7.3 mg/dL — ABNORMAL HIGH (ref 2.5–4.6)
Sodium: 135 mmol/L (ref 135–145)

## 2017-11-12 LAB — CBG MONITORING, ED: GLUCOSE-CAPILLARY: 82 mg/dL (ref 65–99)

## 2017-11-12 LAB — HIV ANTIBODY (ROUTINE TESTING W REFLEX): HIV SCREEN 4TH GENERATION: NONREACTIVE

## 2017-11-12 MED ORDER — ACETAMINOPHEN 650 MG RE SUPP: 650.0000 mg | Freq: Four times a day (QID) | RECTAL | Status: DC | PRN

## 2017-11-12 MED ORDER — ONDANSETRON HCL 4 MG PO TABS: 4.0000 mg | ORAL_TABLET | Freq: Four times a day (QID) | ORAL | Status: DC | PRN

## 2017-11-12 MED ORDER — CLOPIDOGREL BISULFATE 75 MG PO TABS
75.0000 mg | ORAL_TABLET | Freq: Every day | ORAL | Status: DC
  Administered 2017-11-12 – 2017-11-18 (×7): 75 mg via ORAL
  Filled 2017-11-12 (×8): qty 1

## 2017-11-12 MED ORDER — ONDANSETRON HCL 4 MG/2ML IJ SOLN: 4.0000 mg | Freq: Four times a day (QID) | INTRAMUSCULAR | Status: DC | PRN

## 2017-11-12 MED ORDER — CALCIUM ACETATE (PHOS BINDER) 667 MG PO CAPS
1334.0000 mg | ORAL_CAPSULE | Freq: Three times a day (TID) | ORAL | Status: DC
  Administered 2017-11-12 – 2017-11-18 (×18): 1334 mg via ORAL
  Filled 2017-11-12 (×19): qty 2

## 2017-11-12 MED ORDER — HYDRALAZINE HCL 25 MG PO TABS
25.0000 mg | ORAL_TABLET | Freq: Three times a day (TID) | ORAL | Status: DC
  Administered 2017-11-12 – 2017-11-16 (×14): 25 mg via ORAL
  Filled 2017-11-12 (×13): qty 1

## 2017-11-12 MED ORDER — LIDOCAINE-PRILOCAINE 2.5-2.5 % EX CREA: 1.0000 "application " | TOPICAL_CREAM | CUTANEOUS | Status: DC | PRN

## 2017-11-12 MED ORDER — AMLODIPINE BESYLATE 10 MG PO TABS
10.0000 mg | ORAL_TABLET | Freq: Every day | ORAL | Status: DC
  Administered 2017-11-12 – 2017-11-18 (×7): 10 mg via ORAL
  Filled 2017-11-12 (×7): qty 1

## 2017-11-12 MED ORDER — CARVEDILOL 3.125 MG PO TABS
3.1250 mg | ORAL_TABLET | Freq: Two times a day (BID) | ORAL | Status: DC
  Filled 2017-11-12 (×2): qty 1

## 2017-11-12 MED ORDER — HEPARIN SODIUM (PORCINE) 1000 UNIT/ML DIALYSIS
5000.0000 [IU] | INTRAMUSCULAR | Status: DC | PRN
  Administered 2017-11-12: 5000 [IU] via INTRAVENOUS_CENTRAL
  Filled 2017-11-12 (×2): qty 5

## 2017-11-12 MED ORDER — SODIUM CHLORIDE 0.9 % IV SOLN: 100.0000 mL | INTRAVENOUS | Status: DC | PRN

## 2017-11-12 MED ORDER — INSULIN ASPART 100 UNIT/ML IV SOLN
10.0000 [IU] | Freq: Once | INTRAVENOUS | Status: AC
  Administered 2017-11-12: 10 [IU] via INTRAVENOUS
  Filled 2017-11-12: qty 0.1

## 2017-11-12 MED ORDER — PENTAFLUOROPROP-TETRAFLUOROETH EX AERO: 1.0000 "application " | INHALATION_SPRAY | CUTANEOUS | Status: DC | PRN

## 2017-11-12 MED ORDER — CALCITRIOL 0.25 MCG PO CAPS
0.2500 ug | ORAL_CAPSULE | ORAL | Status: DC
  Administered 2017-11-13 – 2017-11-18 (×3): 0.25 ug via ORAL
  Filled 2017-11-12 (×4): qty 1

## 2017-11-12 MED ORDER — METOPROLOL TARTRATE 5 MG/5ML IV SOLN
5.0000 mg | Freq: Once | INTRAVENOUS | Status: AC
  Administered 2017-11-12: 5 mg via INTRAVENOUS
  Filled 2017-11-12: qty 5

## 2017-11-12 MED ORDER — DOXAZOSIN MESYLATE 2 MG PO TABS
8.0000 mg | ORAL_TABLET | Freq: Every day | ORAL | Status: DC
  Administered 2017-11-12 – 2017-11-18 (×7): 8 mg via ORAL
  Filled 2017-11-12: qty 4
  Filled 2017-11-12: qty 1
  Filled 2017-11-12 (×6): qty 4

## 2017-11-12 MED ORDER — ASPIRIN EC 81 MG PO TBEC
162.0000 mg | DELAYED_RELEASE_TABLET | Freq: Every day | ORAL | Status: DC
  Administered 2017-11-12 – 2017-11-18 (×7): 162 mg via ORAL
  Filled 2017-11-12 (×7): qty 2

## 2017-11-12 MED ORDER — FUROSEMIDE 10 MG/ML IJ SOLN
40.0000 mg | Freq: Once | INTRAMUSCULAR | Status: AC
  Administered 2017-11-12: 40 mg via INTRAVENOUS
  Filled 2017-11-12: qty 4

## 2017-11-12 MED ORDER — ACETAMINOPHEN 325 MG PO TABS: 650.0000 mg | ORAL_TABLET | Freq: Four times a day (QID) | ORAL | Status: DC | PRN

## 2017-11-12 MED ORDER — DEXTROSE 50 % IV SOLN
1.0000 | Freq: Once | INTRAVENOUS | Status: AC
  Administered 2017-11-12: 50 mL via INTRAVENOUS
  Filled 2017-11-12: qty 50

## 2017-11-12 MED ORDER — HEPARIN SODIUM (PORCINE) 5000 UNIT/ML IJ SOLN
5000.0000 [IU] | Freq: Three times a day (TID) | INTRAMUSCULAR | Status: DC
  Administered 2017-11-12 (×2): 5000 [IU] via SUBCUTANEOUS
  Filled 2017-11-12 (×8): qty 1

## 2017-11-12 NOTE — ED Notes (Signed)
Dr. [place] Pink contacted with [facility].

## 2017-11-12 NOTE — ED Notes (Signed)
Alert and oriented x4. Skin warm and dry. Ambulatory with steady gait. Pt states has misses dialysis and feels like he has too much fluid on him.  Left arm restrict. Denies dizziness SOB, n/v/d. States had not eaten since yesterday

## 2017-11-12 NOTE — ED Notes (Signed)
Pt's 1200 ml fluid restrictions dinner tray ordered.

## 2017-11-12 NOTE — ED Notes (Signed)
Patient denies pain and is resting comfortably.  

## 2017-11-12 NOTE — ED Notes (Signed)
Pt's dinner tray arrived.

## 2017-11-12 NOTE — Progress Notes (Signed)
HD tx initiated via 15Gx2 w/o problem, pull/push/flush well w/o problem, VSS, will cont to monitor while on HD tx 

## 2017-11-12 NOTE — ED Notes (Signed)
RENAL TRAY ORDERED

## 2017-11-12 NOTE — Progress Notes (Signed)
PROGRESS NOTE                                                                                                                                                                                                             Patient Demographics:    Nicholas Roach, is a 67 y.o. male, DOB - 01-18-1951, IWP:[id number]  Admit date - 11/11/2017   Admitting Physician No admitting provider for patient encounter.  Outpatient Primary MD for the patient is [facility]  LOS - 0  Outpatient Specialists: Nephrology at [facility] Complaint  Patient presents with  . Chest Pain  . Vascular Access Problem       Brief Narrative 67 year old male with ESRD on hemodialysis (Tu, th sat) at [facility] in [place], hep C, hypertension, active cocaine use (last used 3 days back) who had his last hemodialysis on 4/15.  Patient recently got out of prison and had his dialysis in [place] on Monday (4/15) but has been told by his parole officer that he needs to stay in [place].  He was trying to arrange getting back to [place] does not have a local hemodialysis center. Last evening he became short of breath with cough with whitish phlegm and chest tightness.  Denied any fevers, headache, chills, nausea, vomiting, abdominal pain, dysuria (makes minimal urine), diarrhea.  He felt that during his last dialysis they did not take enough fluid out. In the ED is hypertensive with systolic blood pressure in 505L and diastolic blood pressure in low 100s, hypoxic on room air to 88%, mildly tachycardic and tachypneic.  Labs showed potassium of 5.3, BUN of 70 and creatinine of 13.8.  X-ray showing acute pulmonary edema and focal consolidation of right lower lobe. Patient admitted for acute pulmonary edema with fluid overload needing dialysis.   Subjective:   Patient reports breathing to be better this morning.  Denies chest tightness.  Remains  afebrile.   Assessment  & Plan :    Principal Problem:   Acute pulmonary edema (HCC) Secondary to volume overload with end-stage renal disease.  Last dialysis on 4/15.  Maintaining sats on 2 L via nasal cannula.  Received a dose of IV Lasix in the ED. Nephrology consulted for inpatient dialysis today.    ESRD on dialysis [facility])  Reports being on dialysis for past 3 years.  On Tu, th sat schedule, last HD on Monday.  Continue calcitriol, calcium acetate.  Nephrology consult.  Patient is trying to make arrangements to go back to [facility] for his HD.   Uncontrolled hypertension Continue amlodipine, I will add hydralazine 25 mg 3 times daily.  Hold Coreg given active cocaine use (last use 3 days back)  ?  Coronary artery disease On aspirin and Plavix.  Hyperkalemia Peaked T waves and lateral ST depression.  No old EKG to compare.  Should correct with dialysis.  Polysubstance abuse Reports tobacco use and also has active cocaine use.  Counseled strongly on cessation.  Code Status : Full code  Family Communication  : None at bedside  Disposition Plan  : Home possibly tomorrow once dialyzed and outpatient HD confirmed  Barriers For Discharge : Active symptoms  Consults  : Nephrology  Procedures  : None  DVT Prophylaxis  : Heparin -   Lab Results  Component Value Date   PLT 148 (L) 11/11/2017    Antibiotics  :    Anti-infectives (From admission, onward)   None        Objective:   Vitals:   11/12/17 0730 11/12/17 0745 11/12/17 0800 11/12/17 0815  BP: (!) 180/109 (!) 182/110 (!) 165/102 (!) 161/103  Pulse: 98 100 (!) 102 96  Resp: 16 (!) 26 (!) 25 (!) 21  Temp:      TempSrc:      SpO2: 94% 96% 94% 96%  Weight:      Height:        Wt Readings from Last 3 Encounters:  11/12/17 80.3 kg (177 lb)  01/29/17 71.2 kg (156 lb 15.5 oz)  07/05/16 70.3 kg (155 lb)    No intake or output data in the 24 hours ending 11/12/17 0915   Physical Exam  Gen: not in  distress HEENT: moist mucosa, supple neck Chest: Fine bibasilar crackles CVS: N S1&S2, no murmurs, rubs or gallop GI: soft, NT, ND, BS+ Musculoskeletal: warm, trace leg edema, left AV fistula     Data Review:    CBC Recent Labs  Lab 11/11/17 2049  WBC 9.7  HGB 11.1*  HCT 33.5*  PLT 148*  MCV 90.3  MCH 29.9  MCHC 33.1  RDW 15.1    Chemistries  Recent Labs  Lab 11/11/17 2049  NA 135  K 5.3*  CL 97*  CO2 19*  GLUCOSE 107*  BUN 70*  CREATININE 13.80*  CALCIUM 8.6*   ------------------------------------------------------------------------------------------------------------------ No results for input(s): CHOL, HDL, LDLCALC, TRIG, CHOLHDL, LDLDIRECT in the last 72 hours.  No results found for: HGBA1C ------------------------------------------------------------------------------------------------------------------ No results for input(s): TSH, T4TOTAL, T3FREE, THYROIDAB in the last 72 hours.  Invalid input(s): FREET3 ------------------------------------------------------------------------------------------------------------------ No results for input(s): VITAMINB12, FOLATE, FERRITIN, TIBC, IRON, RETICCTPCT in the last 72 hours.  Coagulation profile No results for input(s): INR, PROTIME in the last 168 hours.  No results for input(s): DDIMER in the last 72 hours.  Cardiac Enzymes No results for input(s): CKMB, TROPONINI, MYOGLOBIN in the last 168 hours.  Invalid input(s): CK ------------------------------------------------------------------------------------------------------------------    Component Value Date/Time   BNP 1,731.2 (H) 07/03/2016 0174    Inpatient Medications  Scheduled Meds: . amLODipine  10 mg Oral Daily  . aspirin EC  162 mg Oral Daily  . [START ON 11/13/2017] calcitRIOL  0.25 mcg Oral Q M,W,F  . calcium acetate  1,334 mg Oral TID WC  . carvedilol  3.125 mg Oral BID WC  . clopidogrel  75  mg Oral Daily  . doxazosin  8 mg Oral Daily  .  heparin  5,000 Units Subcutaneous Q8H   Continuous Infusions: PRN Meds:.acetaminophen **OR** acetaminophen, ondansetron **OR** ondansetron (ZOFRAN) IV  Micro Results No results found for this or any previous visit (from the past 240 hour(s)).  Radiology Reports Dg Chest 2 View  Result Date: 11/11/2017 CLINICAL DATA:  Shortness of breath and chest tightness tonight. 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 EXAM: CHEST - 2 VIEW COMPARISON:  Chest radiograph July 03, 2016 FINDINGS: Cardiac silhouette is mildly enlarged. Status post median sternotomy for cardiac valve replacement. Calcified aortic knob. Diffuse interstitial prominence somewhat confluent in the lung bases with RIGHT lower lobe consolidation. Small RIGHT pleural effusion. Stable apical granulomas. No pneumothorax. Soft tissue planes and included osseous structures are nonsuspicious. Mild chronic wedging of lower thoracic vertebral bodies. IMPRESSION: Interstitial prominence most compatible with pulmonary edema confluent in the lung bases. Focal consolidation RIGHT lower lobe. Small RIGHT pleural effusion. Recommend follow-up chest radiograph after treatment to verify improvement. Mild cardiomegaly. Aortic Atherosclerosis (ICD10-I70.0). Electronically Signed   By: Elon Alas M.D.   On: 11/11/2017 21:28    Time Spent in minutes  20   Emmersen Garraway M.D on 11/12/2017 at 9:15 AM  Between 7am to 7pm - Pager - [phone]  After 7pm go to [email or web address] - password [facility]  [facility] -  Office  [phone]

## 2017-11-12 NOTE — H&P (Signed)
History and Physical    Nicholas Roach:[id number] DOB: 1951-05-27 DOA: 11/11/2017  [facility]P: [facility]  Patient coming from: Home  I have personally briefly reviewed patient's old medical records in [place]  Chief Complaint: SOB, needs dialysis  HPI: Nicholas Roach is a 67 y.o. male with medical history significant of ESRD, HTN, previous cocaine use.  Last full dialysis treatment Monday in [place], pt states that when he left Monday he had almost 2 Liters of fluid that was not removed.  Patient recently got out of prison.  Since Monday patient has since been told he cant go back to dialysis clinic in [place] because his parole terms dont allow him to leave [place] so he needs to find dialysis center in [facility] which he hasnt yet.   ED Course: CXR shows some pulm edema.  Got lasix.  K 5.3, BUN 70, creat 13.8.  Trop 0.08.  EKG with some peaking of T waves.   Review of Systems: As per HPI otherwise 10 point review of systems negative.   Past Medical History:  Diagnosis Date  . CKD (chronic kidney disease) stage 3, GFR 30-59 ml/min (HCC)   . Hepatitis C   . Hypertension   . Renal disorder   . Renal insufficiency     Past Surgical History:  Procedure Laterality Date  . FOOT FRACTURE SURGERY Left      reports that he has been smoking cigarettes.  He has never used smokeless tobacco. He reports that he drinks alcohol. He reports that he has current or past drug history. Drug: Cocaine.  Allergies  Allergen Reactions  . Lisinopril Other (See Comments)    cough    Family History  Problem Relation Age of Onset  . Hypertension Mother   . Heart Problems Mother      Prior to Admission medications   Medication Sig Start Date End Date Taking? Authorizing Provider  amLODipine (NORVASC) 10 MG tablet Take 10 mg by mouth daily.   Yes [provider]  aspirin EC 81 MG tablet Take 162 mg by mouth daily. 09/10/16  Yes [provider]  calcitRIOL (ROCALTROL) 0.25 MCG capsule Take 0.25 mcg by mouth every Monday, Wednesday, and Friday.   Yes [provider]  calcium acetate (PHOSLO) 667 MG capsule Take 1,334 mg by mouth 3 (three) times daily with meals.   Yes [provider]  carvedilol (COREG) 3.125 MG tablet Take 3.125 mg by mouth 2 (two) times daily. 09/10/16  Yes [provider]  clopidogrel (PLAVIX) 75 MG tablet Take 75 mg by mouth daily. 09/10/16  Yes [provider]  doxazosin (CARDURA) 8 MG tablet Take 8 mg by mouth daily.   Yes [provider]    Physical Exam: Vitals:   11/12/17 0045 11/12/17 0100 11/12/17 0115 11/12/17 0134  BP: (!) 181/103 (!) 183/111    Pulse: 99 100 (!) 106   Resp: (!) 24 19 20    Temp:      TempSrc:      SpO2: 91% 93% 91%   Weight:    80.3 kg (177 lb)  Height:        Constitutional: NAD, calm, comfortable Eyes: PERRL, lids and conjunctivae normal ENMT: Mucous membranes are moist. Posterior pharynx clear of any exudate or lesions.Normal dentition.  Neck: normal, supple, no masses, no thyromegaly Respiratory: clear to auscultation bilaterally, no wheezing, no crackles. Normal respiratory effort. No accessory muscle use.  Cardiovascular: Regular rate and  rhythm, no murmurs / rubs / gallops. No extremity edema. 2+ pedal pulses. No carotid bruits.  Abdomen: no tenderness, no masses palpated. No hepatosplenomegaly. Bowel sounds positive.  Musculoskeletal: no clubbing / cyanosis. No joint deformity upper and lower extremities. Good ROM, no contractures. Normal muscle tone.  Skin: no rashes, lesions, ulcers. No induration Neurologic: CN 2-12 grossly intact. Sensation intact, DTR normal. Strength 5/5 in all 4.  Psychiatric: Normal judgment and insight. Alert and oriented x 3. Normal mood.    Labs on Admission: I have personally reviewed following labs and imaging studies  CBC: Recent Labs  Lab 11/11/17 2049  WBC 9.7  HGB 11.1*    HCT 33.5*  MCV 90.3  PLT 299*   Basic Metabolic Panel: Recent Labs  Lab 11/11/17 2049  NA 135  K 5.3*  CL 97*  CO2 19*  GLUCOSE 107*  BUN 70*  CREATININE 13.80*  CALCIUM 8.6*   GFR: Estimated Creatinine Clearance: 5.3 mL/min (A) (by C-G formula based on SCr of 13.8 mg/dL (H)). Liver Function Tests: No results for input(s): AST, ALT, ALKPHOS, BILITOT, PROT, ALBUMIN in the last 168 hours. No results for input(s): LIPASE, AMYLASE in the last 168 hours. No results for input(s): AMMONIA in the last 168 hours. Coagulation Profile: No results for input(s): INR, PROTIME in the last 168 hours. Cardiac Enzymes: No results for input(s): CKTOTAL, CKMB, CKMBINDEX, TROPONINI in the last 168 hours. BNP (last 3 results) No results for input(s): PROBNP in the last 8760 hours. HbA1C: No results for input(s): HGBA1C in the last 72 hours. CBG: Recent Labs  Lab 11/12/17 0126  GLUCAP 82   Lipid Profile: No results for input(s): CHOL, HDL, LDLCALC, TRIG, CHOLHDL, LDLDIRECT in the last 72 hours. Thyroid Function Tests: No results for input(s): TSH, T4TOTAL, FREET4, T3FREE, THYROIDAB in the last 72 hours. Anemia Panel: No results for input(s): VITAMINB12, FOLATE, FERRITIN, TIBC, IRON, RETICCTPCT in the last 72 hours. Urine analysis: No results found for: COLORURINE, APPEARANCEUR, LABSPEC, [place], GLUCOSEU, HGBUR, BILIRUBINUR, KETONESUR, PROTEINUR, UROBILINOGEN, NITRITE, LEUKOCYTESUR  Radiological Exams on Admission: Dg Chest 2 View  Result Date: 11/11/2017 CLINICAL DATA:  Shortness of breath and chest tightness tonight. 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 EXAM: CHEST - 2 VIEW COMPARISON:  Chest radiograph July 03, 2016 FINDINGS: Cardiac silhouette is mildly enlarged. Status post median sternotomy for cardiac valve replacement. Calcified aortic knob. Diffuse interstitial prominence somewhat confluent in the lung bases with RIGHT lower  lobe consolidation. Small RIGHT pleural effusion. Stable apical granulomas. No pneumothorax. Soft tissue planes and included osseous structures are nonsuspicious. Mild chronic wedging of lower thoracic vertebral bodies. IMPRESSION: Interstitial prominence most compatible with pulmonary edema confluent in the lung bases. Focal consolidation RIGHT lower lobe. Small RIGHT pleural effusion. Recommend follow-up chest radiograph after treatment to verify improvement. Mild cardiomegaly. Aortic Atherosclerosis (ICD10-I70.0). Electronically Signed   By: Elon Alas M.D.   On: 11/11/2017 21:28    EKG: Independently reviewed.  Assessment/Plan Principal Problem:   Acute pulmonary edema (HCC) Active Problems:   ESRD (end stage renal disease) on dialysis (HCC)   Renovascular hypertension   Fluid overload    1. Acute pulm edema and fluid overload - 1. No O2 req right now 2. But has no where outpatient to go for dialysis at the moment 3. So will put in as obs 4. Call nephrology in AM (cant leave message on dialysis line because voice mail seems to be down) but ceritanly dont think  he needs emergent dialysis at 2am. 2. HTN - continue home meds  DVT prophylaxis: Heparin  Code Status: Full Family Communication: No family in room Disposition Plan: Home after dialysis probably Consults called: Call nephrology in AM Admission status: Place in obs   Joanny Dupree, [facility] Pager [phone]  If 7AM-7PM, please contact day team taking care of patient [email or web address] Password [email or web address]  11/12/2017, 2:01 AM

## 2017-11-12 NOTE — ED Notes (Signed)
Pt eating diet and tolerating well.  

## 2017-11-12 NOTE — ED Notes (Signed)
Attemptd to call report

## 2017-11-12 NOTE — Consult Note (Signed)
Renal Service Consult Note [facility]  Nicholas Roach 11/12/2017 [place] Requesting Physician:  Dr Clementeen Graham  Reason for Consult:  ESRD pt admitted for SOB HPI: The patient is a 67 y.o. year-old with hx of HTN, ESRD on HD x 3 yrs, hep C, sp CABG in 2018 at [facility], presented to ED saying he didn't have a dialysis unit, just got out of prison and was supposed to go to [facility] but he couldn't get a hold of them there. In ED BP's were high, SpO2 was low 90's, cXR showed edema. Pt admitted.  We are asked to see for ESRD.   Pt started HD 3 yrs ago in [place] then was on HD in [place]. ESRD due to HTN and "drugs".   Went to jail/ prison July 2018 and was released this week after HD on Monday.  Had been getting HD in [place] on a MWF schedule while in prison in [place].  Has son and a daughter in [place], is single.  Quit smoking , drinks occ etoh.  Living in a shelter here, says they have a good shelter for veterans in [place].    EChart: Dec 17 - vol overload, ESRD, treated w/ HD, also HTN, ackd, sec hpth, substance abuse   ROS  denies CP  no joint pain   no HA  no blurry vision  no rash  no diarrhea  no nausea/ vomiting    Past Medical History  Past Medical History:  Diagnosis Date  . CKD (chronic kidney disease) stage 3, GFR 30-59 ml/min (HCC)   . Hepatitis C   . Hypertension   . Renal disorder   . Renal insufficiency    Past Surgical History  Past Surgical History:  Procedure Laterality Date  . FOOT FRACTURE SURGERY Left    Family History  Family History  Problem Relation Age of Onset  . Hypertension Mother   . Heart Problems Mother    Social History  reports that he has been smoking cigarettes.  He has never used smokeless tobacco. He reports that he drinks alcohol. He reports that he has current or past drug history. Drug: Cocaine. Allergies  Allergies  Allergen Reactions  . Lisinopril Other (See Comments)    cough   Home  medications Prior to Admission medications   Medication Sig Start Date End Date Taking? Authorizing Provider  amLODipine (NORVASC) 10 MG tablet Take 10 mg by mouth daily.   Yes [provider]  aspirin EC 81 MG tablet Take 162 mg by mouth daily. 09/10/16  Yes [provider]  calcitRIOL (ROCALTROL) 0.25 MCG capsule Take 0.25 mcg by mouth every Monday, Wednesday, and Friday.   Yes [provider]  calcium acetate (PHOSLO) 667 MG capsule Take 1,334 mg by mouth 3 (three) times daily with meals.   Yes [provider]  carvedilol (COREG) 3.125 MG tablet Take 3.125 mg by mouth 2 (two) times daily. 09/10/16  Yes [provider]  clopidogrel (PLAVIX) 75 MG tablet Take 75 mg by mouth daily. 09/10/16  Yes [provider]  doxazosin (CARDURA) 8 MG tablet Take 8 mg by mouth daily.   Yes [provider]   Liver Function Tests No results for input(s): AST, ALT, ALKPHOS, BILITOT, PROT, ALBUMIN in the last 168 hours. No results for input(s): LIPASE, AMYLASE in the last 168 hours. CBC Recent Labs  Lab 11/11/17 2049  WBC 9.7  HGB 11.1*  HCT 33.5*  MCV 90.3  PLT 546*   Basic Metabolic Panel Recent Labs  Lab 11/11/17 2049  NA 135  K 5.3*  CL 97*  CO2 19*  GLUCOSE 107*  BUN 70*  CREATININE 13.80*  CALCIUM 8.6*   Iron/TIBC/Ferritin/ %Sat    Component Value Date/Time   IRON 40 (L) 07/03/2016 1805   TIBC 196 (L) 07/03/2016 1805   FERRITIN 305 07/03/2016 1805   IRONPCTSAT 20 07/03/2016 1805    Vitals:   11/12/17 1000 11/12/17 1030 11/12/17 1100 11/12/17 1130  BP: (!) 178/110 (!) 183/110 (!) 187/106 (!) 186/92  Pulse: 95 98 97 (!) 103  Resp: 15 16 16  (!) 21  Temp:      TempSrc:      SpO2: 94% 96% 96% 92%  Weight:      Height:       Exam Gen alert, no distress No rash, cyanosis or gangrene Sclera anicteric, throat clear  No jvd or bruits Chest clear bilat RRR 2/6 sem llsb, no RG Abd soft ntnd no mass or ascites +bs GU  normal male MS no joint effusions or deformity Ext no LE edema, no wounds or ulcers Neuro is alert, Ox 3 , nf LUE aVF+bruit    Dialysis: not sure, was getting HD in [place] since July 2018 per patient       Impression: 1  ESRD - pt is accepted at the [facility] HD unit on [address] in [place], [place] on TTS schedule, 11:20 am arrival time.  They were expecting him today.  Will plan HD here today and possibly tomorrow. If pt is going to live in [place], they have a spot and he will start there this Saturday. If pt is staying in [place], will have to initiate CLIP process.  Pt said he wants to live in [place], however he doesn't have a place and would be staying in a shelter from what he is saying.  2  Vol overload / pulm edema - not severe 3  HTN 4  Hx CABG 5  Mild hyperkalemia 6  Anemia of ckd - Hb 11, no esa   Plan - as above  Kelly Splinter MD [employer] pager [phone]   11/12/2017, 12:41 PM

## 2017-11-12 NOTE — ED Provider Notes (Signed)
[facility] EMERGENCY DEPARTMENT Provider Note   CSN: [id number] Arrival date & time: 11/11/17  2009     History   Chief Complaint Chief Complaint  Patient presents with  . Chest Pain  . Vascular Access Problem    HPI Nicholas Roach is a 67 y.o. male.  The history is provided by the patient.  Shortness of Breath  This is a new problem. The problem occurs frequently.The current episode started 6 to 12 hours ago. The problem has been gradually worsening. Associated symptoms include cough, sputum production and chest pain. Pertinent negatives include no fever and no abdominal pain. He has tried nothing for the symptoms. Associated medical issues comments: ESRD.  Patient with history of end-stage renal disease on dialysis, history of hypertension, history of previous drug abuse presents with chest pain shortness of breath.  He reports his last dialysis was 2 days ago, but he does not feel they took off enough fluid.  Over the past 6-12 hours he has  been having increasing cough, shortness of breath and chest tightness.  He also reports orthopnea.  Past Medical History:  Diagnosis Date  . CKD (chronic kidney disease) stage 3, GFR 30-59 ml/min (HCC)   . Hepatitis C   . Hypertension   . Renal disorder   . Renal insufficiency     Patient Active Problem List   Diagnosis Date Noted  . Pulmonary edema 07/03/2016  . Acute pulmonary edema ([place]) 07/03/2016  . Hyperkalemia 07/03/2016  . Normocytic anemia 05/21/2016  . Melena 05/21/2016  . ESRD (end stage renal disease) on dialysis ([place]) 05/21/2016  . 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 Hepatitis C 05/21/2016  . Hemoptysis 05/21/2016    Past Surgical History:  Procedure Laterality Date  . FOOT FRACTURE SURGERY Left         Home Medications    Prior to Admission medications   Medication Sig Start Date End Date Taking? Authorizing Provider  amLODipine (NORVASC) 10 MG tablet Take 10 mg by mouth daily.    Yes [provider]  aspirin EC 81 MG tablet Take 162 mg by mouth daily. 09/10/16  Yes [provider]  calcitRIOL (ROCALTROL) 0.25 MCG capsule Take 0.25 mcg by mouth every Monday, Wednesday, and Friday.   Yes [provider]  calcium acetate (PHOSLO) 667 MG capsule Take 1,334 mg by mouth 3 (three) times daily with meals.   Yes [provider]  carvedilol (COREG) 3.125 MG tablet Take 3.125 mg by mouth 2 (two) times daily. 09/10/16  Yes [provider]  clopidogrel (PLAVIX) 75 MG tablet Take 75 mg by mouth daily. 09/10/16  Yes [provider]  doxazosin (CARDURA) 8 MG tablet Take 8 mg by mouth daily.   Yes [provider]  calcitRIOL (ROCALTROL) 0.5 MCG capsule Take 1 capsule (0.5 mcg total) by mouth every Monday, Wednesday, and Friday at 6 PM. Patient not taking: Reported on 11/11/2017 07/07/16   Arrien, Jimmy Picket, MD  oxyCODONE-acetaminophen (PERCOCET/ROXICET) 5-325 MG tablet Take 1 tablet by mouth every 4 (four) hours as needed. Patient not taking: Reported on 11/11/2017 07/06/16   Larene Pickett, PA-C  predniSONE (STERAPRED UNI-PAK 21 TAB) 10 MG (21) TBPK tablet Take 1 tablet (10 mg total) by mouth daily. Take 6 tabs by mouth daily  for 2 days, then 5 tabs for 2 days, then 4 tabs for 2 days, then 3 tabs for 2 days, 2 tabs for 2 days, then 1 tab by mouth daily for  2 days Patient not taking: Reported on 11/11/2017 07/06/16   Larene Pickett, PA-C    Family History Family History  Problem Relation Age of Onset  . Hypertension Mother   . Heart Problems Mother     Social History Social History   Tobacco Use  . Smoking status: Current Every Day Smoker    Types: Cigarettes  . Smokeless tobacco: Never Used  Substance Use Topics  . Alcohol use: Yes    Comment: Occasionally  . Drug use: Yes    Types: Cocaine    Comment: Last use 11/09/17     Allergies   Lisinopril   Review of Systems Review of Systems  Constitutional:  Negative for fever.  Respiratory: Positive for cough, sputum production and shortness of breath.   Cardiovascular: Positive for chest pain.  Gastrointestinal: Negative for abdominal pain.  All other systems reviewed and are negative.    Physical Exam Updated Vital Signs BP (!) 194/115   Pulse (!) 107   Temp 98.5 F (36.9 C) (Oral)   Resp 16   Ht 1.753 m (5\' 9" )   Wt 76.2 kg (168 lb)   SpO2 96%   BMI 24.81 kg/m   Physical Exam CONSTITUTIONAL: Elderly HEAD: Normocephalic/atraumatic EYES: EOMI/PERRL ENMT: Mucous membranes moist NECK: supple no meningeal signs, +JVD SPINE/BACK:entire spine nontender CV: S1/S2 noted LUNGS: Crackles bilaterally, mild tachypnea ABDOMEN: soft, nontender, no rebound or guarding, bowel sounds noted throughout abdomen GU:no cva tenderness NEURO: Pt is awake/alert/appropriate, moves all extremitiesx4.  No facial droop.   EXTREMITIES: pulses normal/equal, full ROM, dialysis access to left arm, thrill noted SKIN: warm, color normal PSYCH: no abnormalities of mood noted, alert and oriented to situation   ED Treatments / Results  Labs (all labs ordered are listed, but only abnormal results are displayed) Labs Reviewed  BASIC METABOLIC PANEL - Abnormal; Notable for the following components:      Result Value   Potassium 5.3 (*)    Chloride 97 (*)    CO2 19 (*)    Glucose, Bld 107 (*)    BUN 70 (*)    Creatinine, Ser 13.80 (*)    Calcium 8.6 (*)    GFR calc non Af Amer 3 (*)    GFR calc Af Amer 4 (*)    Anion gap 19 (*)    All other components within normal limits  CBC - Abnormal; Notable for the following components:   RBC 3.71 (*)    Hemoglobin 11.1 (*)    HCT 33.5 (*)    Platelets 148 (*)    All other components within normal limits  HIV ANTIBODY (ROUTINE TESTING)  I-STAT TROPONIN, ED  CBG MONITORING, ED    EKG EKG Interpretation  Date/Time:  Wednesday November 11 2017 20:25:56 EDT Ventricular Rate:  105 PR Interval:  182 QRS  Duration: 128 QT Interval:  380 QTC Calculation: 502 R Axis:   -22 Text Interpretation:  Sinus tachycardia Left ventricular hypertrophy with QRS widening and repolarization abnormality Cannot rule out Septal infarct , age undetermined Abnormal ECG Confirmed by Ripley Fraise [phone]) on 11/12/2017 12:00:11 AM   Radiology Dg Chest 2 View  Result Date: 11/11/2017 CLINICAL DATA:  Shortness of breath and chest tightness tonight. 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 EXAM: CHEST - 2 VIEW COMPARISON:  Chest radiograph July 03, 2016 FINDINGS: Cardiac silhouette is mildly enlarged. Status post median sternotomy for cardiac valve replacement. Calcified aortic knob. Diffuse interstitial prominence somewhat  confluent in the lung bases with RIGHT lower lobe consolidation. Small RIGHT pleural effusion. Stable apical granulomas. No pneumothorax. Soft tissue planes and included osseous structures are nonsuspicious. Mild chronic wedging of lower thoracic vertebral bodies. IMPRESSION: Interstitial prominence most compatible with pulmonary edema confluent in the lung bases. Focal consolidation RIGHT lower lobe. Small RIGHT pleural effusion. Recommend follow-up chest radiograph after treatment to verify improvement. Mild cardiomegaly. Aortic Atherosclerosis (ICD10-I70.0). Electronically Signed   By: Elon Alas M.D.   On: 11/11/2017 21:28    Procedures Procedures (including critical care time)  Medications Ordered in ED Medications  amLODipine (NORVASC) tablet 10 mg (has no administration in time range)  aspirin EC tablet 162 mg (has no administration in time range)  calcitRIOL (ROCALTROL) capsule 0.25 mcg (has no administration in time range)  calcium acetate (PHOSLO) capsule 1,334 mg (has no administration in time range)  carvedilol (COREG) tablet 3.125 mg (has no administration in time range)  clopidogrel (PLAVIX) tablet 75 mg (has no administration in  time range)  doxazosin (CARDURA) tablet 8 mg (has no administration in time range)  acetaminophen (TYLENOL) tablet 650 mg (has no administration in time range)    Or  acetaminophen (TYLENOL) suppository 650 mg (has no administration in time range)  ondansetron (ZOFRAN) tablet 4 mg (has no administration in time range)    Or  ondansetron (ZOFRAN) injection 4 mg (has no administration in time range)  heparin injection 5,000 Units (has no administration in time range)  nitroGLYCERIN (NITROGLYN) 2 % ointment 1 inch (1 inch Topical Given 11/12/17 0133)  furosemide (LASIX) injection 40 mg (40 mg Intravenous Given 11/12/17 0129)  insulin aspart (novoLOG) injection 10 Units (10 Units Intravenous Given 11/12/17 0128)  dextrose 50 % solution 50 mL (50 mLs Intravenous Given 11/12/17 0128)     Initial Impression / Assessment and Plan / ED Course  I have reviewed the triage vital signs and the nursing notes.  Pertinent labs & imaging results that were available during my care of the patient were reviewed by me and considered in my medical decision making (see chart for details).     12:04 AM Patient with history of ESRD here with increasing chest tightness and shortness of breath.  His x-ray is consistent with pulmonary edema, and he will need to have dialysis.  He was recently released from prison, and he had arranged to have his dialysis in [facility].  However he was informed by his parole officer that he must stay in [place].  He has no local dialysis center He is mildly tachypneic but overall in no distress.  He is conversant and appropriate Plan will be to manage his blood pressure, and also manage his mild hyperkalemia.  He would need to be admitted and have dialysis arranged. 2:06 AM D/w dr gardner for admission Pt stable Suspect elevated troponin due to ESRD Final Clinical Impressions(s) / ED Diagnoses   Final diagnoses:  Acute pulmonary edema [facility])  Hyperkalemia    ED  Discharge Orders    None       Ripley Fraise, MD 11/12/17 0207

## 2017-11-13 ENCOUNTER — Other Ambulatory Visit: Payer: Self-pay

## 2017-11-13 DIAGNOSIS — I120 Hypertensive chronic kidney disease with stage 5 chronic kidney disease or end stage renal disease: Secondary | ICD-10-CM | POA: Diagnosis present

## 2017-11-13 DIAGNOSIS — Z888 Allergy status to other drugs, medicaments and biological substances status: Secondary | ICD-10-CM | POA: Diagnosis not present

## 2017-11-13 DIAGNOSIS — Z7902 Long term (current) use of antithrombotics/antiplatelets: Secondary | ICD-10-CM | POA: Diagnosis not present

## 2017-11-13 DIAGNOSIS — E875 Hyperkalemia: Secondary | ICD-10-CM

## 2017-11-13 DIAGNOSIS — Z590 Homelessness: Secondary | ICD-10-CM | POA: Diagnosis not present

## 2017-11-13 DIAGNOSIS — D631 Anemia in chronic kidney disease: Secondary | ICD-10-CM | POA: Diagnosis present

## 2017-11-13 DIAGNOSIS — Z7982 Long term (current) use of aspirin: Secondary | ICD-10-CM | POA: Diagnosis not present

## 2017-11-13 DIAGNOSIS — Z992 Dependence on renal dialysis: Secondary | ICD-10-CM

## 2017-11-13 DIAGNOSIS — Z8249 Family history of ischemic heart disease and other diseases of the circulatory system: Secondary | ICD-10-CM | POA: Diagnosis not present

## 2017-11-13 DIAGNOSIS — F17210 Nicotine dependence, cigarettes, uncomplicated: Secondary | ICD-10-CM | POA: Diagnosis present

## 2017-11-13 DIAGNOSIS — I150 Renovascular hypertension: Secondary | ICD-10-CM | POA: Diagnosis not present

## 2017-11-13 DIAGNOSIS — J810 Acute pulmonary edema: Secondary | ICD-10-CM | POA: Diagnosis not present

## 2017-11-13 DIAGNOSIS — R0602 Shortness of breath: Secondary | ICD-10-CM | POA: Diagnosis present

## 2017-11-13 DIAGNOSIS — N186 End stage renal disease: Secondary | ICD-10-CM | POA: Diagnosis not present

## 2017-11-13 DIAGNOSIS — Z79899 Other long term (current) drug therapy: Secondary | ICD-10-CM | POA: Diagnosis not present

## 2017-11-13 DIAGNOSIS — I2510 Atherosclerotic heart disease of native coronary artery without angina pectoris: Secondary | ICD-10-CM | POA: Diagnosis present

## 2017-11-13 DIAGNOSIS — F1490 Cocaine use, unspecified, uncomplicated: Secondary | ICD-10-CM | POA: Diagnosis present

## 2017-11-13 DIAGNOSIS — E8770 Fluid overload, unspecified: Secondary | ICD-10-CM | POA: Diagnosis present

## 2017-11-13 DIAGNOSIS — Z951 Presence of aortocoronary bypass graft: Secondary | ICD-10-CM | POA: Diagnosis not present

## 2017-11-13 DIAGNOSIS — N2581 Secondary hyperparathyroidism of renal origin: Secondary | ICD-10-CM | POA: Diagnosis present

## 2017-11-13 DIAGNOSIS — R0902 Hypoxemia: Secondary | ICD-10-CM | POA: Diagnosis present

## 2017-11-13 LAB — BASIC METABOLIC PANEL
ANION GAP: 13 (ref 5–15)
BUN: 37 mg/dL — ABNORMAL HIGH (ref 6–20)
CALCIUM: 8.1 mg/dL — AB (ref 8.9–10.3)
CO2: 26 mmol/L (ref 22–32)
CREATININE: 8.28 mg/dL — AB (ref 0.61–1.24)
Chloride: 98 mmol/L — ABNORMAL LOW (ref 101–111)
GFR, EST AFRICAN AMERICAN: 7 mL/min — AB (ref >60)
GFR, EST NON AFRICAN AMERICAN: 6 mL/min — AB (ref >60)
Glucose, Bld: 161 mg/dL — ABNORMAL HIGH (ref 65–99)
Potassium: 3.7 mmol/L (ref 3.5–5.1)
SODIUM: 137 mmol/L (ref 135–145)

## 2017-11-13 LAB — MRSA PCR SCREENING: MRSA BY PCR: POSITIVE — AB

## 2017-11-13 LAB — HEPATITIS B SURFACE ANTIGEN: HEP B S AG: NEGATIVE

## 2017-11-13 MED ORDER — CHLORHEXIDINE GLUCONATE CLOTH 2 % EX PADS
6.0000 | MEDICATED_PAD | Freq: Every day | CUTANEOUS | Status: AC
  Administered 2017-11-13 – 2017-11-17 (×4): 6 via TOPICAL

## 2017-11-13 MED ORDER — MUPIROCIN 2 % EX OINT
1.0000 "application " | TOPICAL_OINTMENT | Freq: Two times a day (BID) | CUTANEOUS | Status: AC
  Administered 2017-11-13 – 2017-11-16 (×7): 1 via NASAL
  Filled 2017-11-13 (×4): qty 22

## 2017-11-13 NOTE — Progress Notes (Signed)
[facility] Progress Note  Subjective: 4L off w HD last night, no BP drops, BP's remained high  Vitals:   11/13/17 0154 11/13/17 0633 11/13/17 1025 11/13/17 1026  BP: (!) 167/96 (!) 160/101  (!) 151/93  Pulse: 94  95 95  Resp:    18  Temp:  98.6 F (37 C)  98.5 F (36.9 C)  TempSrc:  Oral  Oral  SpO2: 98% 98% 99% 95%  Weight:      Height:        Inpatient medications: . amLODipine  10 mg Oral Daily  . aspirin EC  162 mg Oral Daily  . calcitRIOL  0.25 mcg Oral Q M,W,F  . calcium acetate  1,334 mg Oral TID WC  . Chlorhexidine Gluconate Cloth  6 each Topical Q0600  . clopidogrel  75 mg Oral Daily  . doxazosin  8 mg Oral Daily  . heparin  5,000 Units Subcutaneous Q8H  . hydrALAZINE  25 mg Oral Q8H  . mupirocin ointment  1 application Nasal BID   . sodium chloride     sodium chloride, acetaminophen **OR** acetaminophen, heparin, lidocaine-prilocaine, ondansetron **OR** ondansetron (ZOFRAN) IV, pentafluoroprop-tetrafluoroeth  Exam: Gen alert, no distress No jvd or bruits Chest clear bilat RRR 2/6 sem llsb, no RG Abd soft ntnd no mass or ascites +bs MS no joint effusions or deformity Ext no LE edema, no wounds or ulcers Neuro is alert, Ox 3 , nf LFA aVF+bruit    Dialysis:  was getting HD in [place] since July 2018 per patient; LFA AVF      Impression: 1  ESRD - pt was accepted at the [facility] HD unit on [address] in [place], [place] on TTS schedule, 2nd shift, however it appears that patient will be staying in [place] now for the foreseeable future. Will initiate CLIP process for outpt HD in [place] 2  Vol overload / pulm edema - 4L off yest, plan another 4L Sat w HD  3  HTN 4  Hx CABG 5  Mild hyperkalemia - resolved 6  Anemia of ckd - Hb 11, no esa 7  Homelessness - will consult SW   Plan - HD Saturday, start CLIP process   Kelly Splinter MD [facility] pager [phone]   11/13/2017, 12:32 PM   Recent Labs  Lab  11/11/17 2049 11/12/17 2130 11/13/17 0348  NA 135 135 137  K 5.3* 5.2* 3.7  CL 97* 97* 98*  CO2 19* 20* 26  GLUCOSE 107* 108* 161*  BUN 70* 105* 37*  CREATININE 13.80* 16.15* 8.28*  CALCIUM 8.6* 7.9* 8.1*  PHOS  --  7.3*  --    Recent Labs  Lab 11/12/17 2130  ALBUMIN 2.9*   Recent Labs  Lab 11/11/17 2049 11/12/17 2207  WBC 9.7 6.8  HGB 11.1* 8.8*  HCT 33.5* 26.9*  MCV 90.3 89.4  PLT 148* 114*   Iron/TIBC/Ferritin/ %Sat    Component Value Date/Time   IRON 40 (L) 07/03/2016 1805   TIBC 196 (L) 07/03/2016 1805   FERRITIN 305 07/03/2016 1805   IRONPCTSAT 20 07/03/2016 1805

## 2017-11-13 NOTE — Progress Notes (Signed)
PROGRESS NOTE    Nicholas Roach  DUK:[id number] DOB: 1951/07/03 DOA: 11/11/2017 PCP: [facility]    Brief Narrative:   67 year old male with ESRD on hemodialysis (Tu, th sat) at [facility] in [place], hep C, hypertension, active cocaine use (last used 3 days back) who had his last hemodialysis on 4/15.  Patient recently got out of prison and had his dialysis in [place] on Monday (4/15) but has been told by his parole officer that he needs to stay in [place].  He was trying to arrange getting back to [place] does not have a local hemodialysis center. Last evening he became short of breath with cough with whitish phlegm and chest tightness.  Denied any fevers, headache, chills, nausea, vomiting, abdominal pain, dysuria (makes minimal urine), diarrhea.  He felt that during his last dialysis they did not take enough fluid out. In the ED is hypertensive with systolic blood pressure in 376E and diastolic blood pressure in low 100s, hypoxic on room air to 88%, mildly tachycardic and tachypneic.  Labs showed potassium of 5.3, BUN of 70 and creatinine of 13.8.  X-ray showing acute pulmonary edema and focal consolidation of right lower lobe. Patient admitted for acute pulmonary edema with fluid overload needing dialysis.   Assessment & Plan:   Principal Problem:   Acute pulmonary edema (HCC) Active Problems:   ESRD (end stage renal disease) on dialysis ([place])   Renovascular hypertension   Fluid overload   End stage renal disease ([place])   1. Acute pulmonary edema.  Secondary to volume overload with end-stage renal disease.  Patient underwent hemodialysis with removal of 4 L of fluid.  Respiratory status has improved and is breathing comfortably at this time. 2. End-stage renal disease on hemodialysis.  Arrangements are being made for patient to be set up with an outpatient dialysis center in [place].  He was previously set up with a dialysis Center in [facility], but he  will not be returning to that area. 3. Hypertension.  Continue on amlodipine and hydralazine.  Coreg on hold given recent cocaine use.  Blood pressure currently stable. 4. Possible coronary artery disease.  Continue on aspirin and Plavix. 5. Anemia of chronic kidney disease.  Hemoglobin appears to be near baseline.  No signs of GI bleeding.  Continue to monitor.   DVT prophylaxis: Heparin Code Status: Full code Family Communication: No family present Disposition Plan: Discharge home once arrangements for outpatient dialysis have been completed   Consultants:   Nephrology  Procedures:     Antimicrobials:      Subjective: Patient denies any chest pain or shortness of breath.  Objective: Vitals:   11/13/17 0633 11/13/17 1025 11/13/17 1026 11/13/17 1618  BP: (!) 160/101  (!) 151/93 139/88  Pulse:  95 95 (!) 102  Resp:   18 18  Temp: 98.6 F (37 C)  98.5 F (36.9 C) 98 F (36.7 C)  TempSrc: Oral  Oral Oral  SpO2: 98% 99% 95% 92%  Weight:      Height:        Intake/Output Summary (Last 24 hours) at 11/13/2017 1805 Last data filed at 11/13/2017 1405 Gross per 24 hour  Intake 780 ml  Output 4000 ml  Net -3220 ml   Filed Weights   11/12/17 2056 11/12/17 2125 11/13/17 0152  Weight: 80.2 kg (176 lb 12.9 oz) 81.7 kg (180 lb 1.9 oz) 77.7 kg (171 lb 4.8 oz)    Examination:  General exam: Appears calm and comfortable  Respiratory system: Clear to auscultation. Respiratory effort normal. Cardiovascular system: S1 & S2 heard, RRR. No JVD, murmurs, rubs, gallops or clicks. No pedal edema. Gastrointestinal system: Abdomen is nondistended, soft and nontender. No organomegaly or masses felt. Normal bowel sounds heard. Central nervous system: Alert and oriented. No focal neurological deficits. Extremities: Symmetric 5 x 5 power. Skin: No rashes, lesions or ulcers Psychiatry: Judgement and insight appear normal. Mood & affect appropriate.     Data Reviewed: I have  personally reviewed following labs and imaging studies  CBC: Recent Labs  Lab 11/11/17 2049 11/12/17 2207  WBC 9.7 6.8  HGB 11.1* 8.8*  HCT 33.5* 26.9*  MCV 90.3 89.4  PLT 148* 924*   Basic Metabolic Panel: Recent Labs  Lab 11/11/17 2049 11/12/17 2130 11/13/17 0348  NA 135 135 137  K 5.3* 5.2* 3.7  CL 97* 97* 98*  CO2 19* 20* 26  GLUCOSE 107* 108* 161*  BUN 70* 105* 37*  CREATININE 13.80* 16.15* 8.28*  CALCIUM 8.6* 7.9* 8.1*  PHOS  --  7.3*  --    GFR: Estimated Creatinine Clearance: 8.9 mL/min (A) (by C-G formula based on SCr of 8.28 mg/dL (H)). Liver Function Tests: Recent Labs  Lab 11/12/17 2130  ALBUMIN 2.9*   No results for input(s): LIPASE, AMYLASE in the last 168 hours. No results for input(s): AMMONIA in the last 168 hours. Coagulation Profile: No results for input(s): INR, PROTIME in the last 168 hours. Cardiac Enzymes: No results for input(s): CKTOTAL, CKMB, CKMBINDEX, TROPONINI in the last 168 hours. BNP (last 3 results) No results for input(s): PROBNP in the last 8760 hours. HbA1C: No results for input(s): HGBA1C in the last 72 hours. CBG: Recent Labs  Lab 11/12/17 0126  GLUCAP 82   Lipid Profile: No results for input(s): CHOL, HDL, LDLCALC, TRIG, CHOLHDL, LDLDIRECT in the last 72 hours. Thyroid Function Tests: No results for input(s): TSH, T4TOTAL, FREET4, T3FREE, THYROIDAB in the last 72 hours. Anemia Panel: No results for input(s): VITAMINB12, FOLATE, FERRITIN, TIBC, IRON, RETICCTPCT in the last 72 hours. Sepsis Labs: No results for input(s): PROCALCITON, LATICACIDVEN in the last 168 hours.  Recent Results (from the past 240 hour(s))  MRSA PCR Screening     Status: Abnormal   Collection Time: 11/13/17  2:39 AM  Result Value Ref Range Status   MRSA by PCR POSITIVE (A) NEGATIVE Final    Comment:        The GeneXpert MRSA Assay (FDA approved for NASAL specimens only), is one component of a comprehensive MRSA colonization surveillance  program. It is not intended to diagnose MRSA infection nor to guide or monitor treatment for MRSA infections. RESULT CALLED TO, READ BACK BY AND VERIFIED WITH: T [facility] RN 11/13/17 2683 JDW Performed at [facility], [place] [address], [place], [place] [address]          Radiology Studies: Dg Chest 2 View  Result Date: 11/11/2017 CLINICAL DATA:  Shortness of breath and chest tightness tonight. 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 EXAM: CHEST - 2 VIEW COMPARISON:  Chest radiograph July 03, 2016 FINDINGS: Cardiac silhouette is mildly enlarged. Status post median sternotomy for cardiac valve replacement. Calcified aortic knob. Diffuse interstitial prominence somewhat confluent in the lung bases with RIGHT lower lobe consolidation. Small RIGHT pleural effusion. Stable apical granulomas. No pneumothorax. Soft tissue planes and included osseous structures are nonsuspicious. Mild chronic wedging of lower thoracic vertebral bodies. IMPRESSION: Interstitial prominence most compatible  with pulmonary edema confluent in the lung bases. Focal consolidation RIGHT lower lobe. Small RIGHT pleural effusion. Recommend follow-up chest radiograph after treatment to verify improvement. Mild cardiomegaly. Aortic Atherosclerosis (ICD10-I70.0). Electronically Signed   By: Elon Alas M.D.   On: 11/11/2017 21:28        Scheduled Meds: . amLODipine  10 mg Oral Daily  . aspirin EC  162 mg Oral Daily  . calcitRIOL  0.25 mcg Oral Q M,W,F  . calcium acetate  1,334 mg Oral TID WC  . Chlorhexidine Gluconate Cloth  6 each Topical Q0600  . clopidogrel  75 mg Oral Daily  . doxazosin  8 mg Oral Daily  . heparin  5,000 Units Subcutaneous Q8H  . hydrALAZINE  25 mg Oral Q8H  . mupirocin ointment  1 application Nasal BID   Continuous Infusions: . sodium chloride       LOS: 0 days     Kathie Dike, MD [facility] Pager [phone]  If  7PM-7AM, please contact night-coverage [email or web address] Password [facility] 11/13/2017, 6:05 PM

## 2017-11-13 NOTE — Plan of Care (Signed)
  Problem: Education: Goal: Knowledge of General Education information will improve Outcome: Progressing   

## 2017-11-13 NOTE — Progress Notes (Signed)
HD tx completed  @ 0135 w/ art site bleeding the entire tx despite multiple dsg changes and addition of gel foam and then quick clot to dsg changes. Pt states it used to do that before and they quit giving him heparin on tx, when needles pulled there was normal bleeding from site. UF goal met, blood rinsed back, VSS, report called to Geanie Kenning, RN

## 2017-11-14 LAB — CBC
HCT: 25.7 % — ABNORMAL LOW (ref 39.0–52.0)
HEMOGLOBIN: 8.7 g/dL — AB (ref 13.0–17.0)
MCH: 29.6 pg (ref 26.0–34.0)
MCHC: 33.9 g/dL (ref 30.0–36.0)
MCV: 87.4 fL (ref 78.0–100.0)
PLATELETS: 121 10*3/uL — AB (ref 150–400)
RBC: 2.94 MIL/uL — ABNORMAL LOW (ref 4.22–5.81)
RDW: 15 % (ref 11.5–15.5)
WBC: 6.6 10*3/uL (ref 4.0–10.5)

## 2017-11-14 LAB — RENAL FUNCTION PANEL
ALBUMIN: 2.9 g/dL — AB (ref 3.5–5.0)
ANION GAP: 15 (ref 5–15)
BUN: 79 mg/dL — ABNORMAL HIGH (ref 6–20)
CALCIUM: 8.2 mg/dL — AB (ref 8.9–10.3)
CO2: 23 mmol/L (ref 22–32)
CREATININE: 13.03 mg/dL — AB (ref 0.61–1.24)
Chloride: 95 mmol/L — ABNORMAL LOW (ref 101–111)
GFR, EST AFRICAN AMERICAN: 4 mL/min — AB (ref >60)
GFR, EST NON AFRICAN AMERICAN: 3 mL/min — AB (ref >60)
Glucose, Bld: 114 mg/dL — ABNORMAL HIGH (ref 65–99)
PHOSPHORUS: 5.9 mg/dL — AB (ref 2.5–4.6)
Potassium: 5.1 mmol/L (ref 3.5–5.1)
SODIUM: 133 mmol/L — AB (ref 135–145)

## 2017-11-14 LAB — HEPATITIS B SURFACE ANTIBODY,QUALITATIVE: Hep B S Ab: REACTIVE

## 2017-11-14 MED ORDER — BLISTEX MEDICATED EX OINT
TOPICAL_OINTMENT | CUTANEOUS | Status: DC | PRN
  Filled 2017-11-14: qty 6.3

## 2017-11-14 MED ORDER — HEPARIN SODIUM (PORCINE) 1000 UNIT/ML DIALYSIS
100.0000 [IU]/kg | Freq: Once | INTRAMUSCULAR | Status: DC
  Filled 2017-11-14: qty 8

## 2017-11-14 NOTE — Progress Notes (Signed)
PROGRESS NOTE    Nicholas Roach  FKC:[id number] DOB: May 10, 1951 DOA: 11/11/2017 PCP: [facility]    Brief Narrative:   67 year old male with ESRD on hemodialysis (Tu, th sat) at [facility] in [place], hep C, hypertension, active cocaine use (last used 3 days back) who had his last hemodialysis on 4/15.  Patient recently got out of prison and had his dialysis in [place] on Monday (4/15) but has been told by his parole officer that he needs to stay in [place].  He was trying to arrange getting back to [place] does not have a local hemodialysis center. Last evening he became short of breath with cough with whitish phlegm and chest tightness.  Denied any fevers, headache, chills, nausea, vomiting, abdominal pain, dysuria (makes minimal urine), diarrhea.  He felt that during his last dialysis they did not take enough fluid out. In the ED is hypertensive with systolic blood pressure in 749S and diastolic blood pressure in low 100s, hypoxic on room air to 88%, mildly tachycardic and tachypneic.  Labs showed potassium of 5.3, BUN of 70 and creatinine of 13.8.  X-ray showing acute pulmonary edema and focal consolidation of right lower lobe. Patient admitted for acute pulmonary edema with fluid overload needing dialysis.   Assessment & Plan:   Principal Problem:   Acute pulmonary edema (HCC) Active Problems:   ESRD (end stage renal disease) on dialysis ([place])   Renovascular hypertension   Fluid overload   End stage renal disease ([place])   1. Acute pulmonary edema.  Secondary to volume overload with end-stage renal disease.  Patient underwent hemodialysis with removal of 4 L of fluid.  Respiratory status has been stable.  Breathing comfortably on room air at this time. 2. End-stage renal disease on hemodialysis.  Arrangements are being made for patient to be set up with an outpatient dialysis center in [place].  He was previously set up with a dialysis Center in [facility], but he will not be returning to that area.  Further dialysis treatments per nephrology 3. Hypertension.  Continue on amlodipine and hydralazine.  Coreg on hold given recent cocaine use.  Blood pressure running high today, but anticipate this should improve after dialysis. 4. Possible coronary artery disease.  Continue on aspirin and Plavix. 5. Anemia of chronic kidney disease.  Hemoglobin appears to be near baseline.  No signs of GI bleeding.  Continue to monitor. 6. Hyperkalemia.  Improved after dialysis.   DVT prophylaxis: Heparin Code Status: Full code Family Communication: No family present Disposition Plan: Discharge home once arrangements for outpatient dialysis have been completed   Consultants:   Nephrology  Procedures:     Antimicrobials:      Subjective: No chest pain or shortness of breath.  Objective: Vitals:   11/13/17 1618 11/13/17 2204 11/14/17 0629 11/14/17 1640  BP: 139/88 (!) 173/105 (!) 160/104 (!) 171/100  Pulse: (!) 102 (!) 103 98 96  Resp: 18 18 16 18   Temp: 98 F (36.7 C) 98.2 F (36.8 C) 98.2 F (36.8 C) 98.2 F (36.8 C)  TempSrc: Oral Oral Oral Oral  SpO2: 92% 93% 100% 92%  Weight:  70.1 kg (154 lb 8.7 oz)    Height:        Intake/Output Summary (Last 24 hours) at 11/14/2017 1900 Last data filed at 11/14/2017 1830 Gross per 24 hour  Intake 620 ml  Output 0 ml  Net 620 ml   Filed Weights   11/12/17 2125 11/13/17 0152 11/13/17 2204  Weight: 81.7 kg (180 lb 1.9 oz) 77.7 kg (171 lb 4.8 oz) 70.1 kg (154 lb 8.7 oz)    Examination:  General exam: Alert, awake, oriented x 3 Respiratory system: Clear to auscultation. Respiratory effort normal. Cardiovascular system:RRR. No murmurs, rubs, gallops. Gastrointestinal system: Abdomen is nondistended, soft and nontender. No organomegaly or masses felt. Normal bowel sounds heard. Central nervous system: Alert and oriented. No focal neurological deficits. Extremities: No C/C/E, +pedal  pulses Skin: No rashes, lesions or ulcers Psychiatry: Judgement and insight appear normal. Mood & affect appropriate.   Data Reviewed: I have personally reviewed following labs and imaging studies  CBC: Recent Labs  Lab 11/11/17 2049 11/12/17 2207  WBC 9.7 6.8  HGB 11.1* 8.8*  HCT 33.5* 26.9*  MCV 90.3 89.4  PLT 148* 976*   Basic Metabolic Panel: Recent Labs  Lab 11/11/17 2049 11/12/17 2130 11/13/17 0348  NA 135 135 137  K 5.3* 5.2* 3.7  CL 97* 97* 98*  CO2 19* 20* 26  GLUCOSE 107* 108* 161*  BUN 70* 105* 37*  CREATININE 13.80* 16.15* 8.28*  CALCIUM 8.6* 7.9* 8.1*  PHOS  --  7.3*  --    GFR: Estimated Creatinine Clearance: 8.8 mL/min (A) (by C-G formula based on SCr of 8.28 mg/dL (H)). Liver Function Tests: Recent Labs  Lab 11/12/17 2130  ALBUMIN 2.9*   No results for input(s): LIPASE, AMYLASE in the last 168 hours. No results for input(s): AMMONIA in the last 168 hours. Coagulation Profile: No results for input(s): INR, PROTIME in the last 168 hours. Cardiac Enzymes: No results for input(s): CKTOTAL, CKMB, CKMBINDEX, TROPONINI in the last 168 hours. BNP (last 3 results) No results for input(s): PROBNP in the last 8760 hours. HbA1C: No results for input(s): HGBA1C in the last 72 hours. CBG: Recent Labs  Lab 11/12/17 0126  GLUCAP 82   Lipid Profile: No results for input(s): CHOL, HDL, LDLCALC, TRIG, CHOLHDL, LDLDIRECT in the last 72 hours. Thyroid Function Tests: No results for input(s): TSH, T4TOTAL, FREET4, T3FREE, THYROIDAB in the last 72 hours. Anemia Panel: No results for input(s): VITAMINB12, FOLATE, FERRITIN, TIBC, IRON, RETICCTPCT in the last 72 hours. Sepsis Labs: No results for input(s): PROCALCITON, LATICACIDVEN in the last 168 hours.  Recent Results (from the past 240 hour(s))  MRSA PCR Screening     Status: Abnormal   Collection Time: 11/13/17  2:39 AM  Result Value Ref Range Status   MRSA by PCR POSITIVE (A) NEGATIVE Final     Comment:        The GeneXpert MRSA Assay (FDA approved for NASAL specimens only), is one component of a comprehensive MRSA colonization surveillance program. It is not intended to diagnose MRSA infection nor to guide or monitor treatment for MRSA infections. RESULT CALLED TO, READ BACK BY AND VERIFIED WITH: T [facility] RN 11/13/17 7341 JDW Performed at [facility], [place] [address], [place], [place] [address]          Radiology Studies: No results found.      Scheduled Meds: . amLODipine  10 mg Oral Daily  . aspirin EC  162 mg Oral Daily  . calcitRIOL  0.25 mcg Oral Q M,W,F  . calcium acetate  1,334 mg Oral TID WC  . Chlorhexidine Gluconate Cloth  6 each Topical Q0600  . clopidogrel  75 mg Oral Daily  . doxazosin  8 mg Oral Daily  . heparin  5,000 Units Subcutaneous Q8H  . [START ON 11/15/2017] heparin  100  Units/kg Dialysis Once in dialysis  . hydrALAZINE  25 mg Oral Q8H  . mupirocin ointment  1 application Nasal BID   Continuous Infusions: . sodium chloride       LOS: 1 day     Kathie Dike, MD [facility] Pager [phone]  If 7PM-7AM, please contact night-coverage [email or web address] Password [email or web address] 11/14/2017, 7:00 PM

## 2017-11-14 NOTE — Progress Notes (Addendum)
[facility] Progress Note  Subjective: no new c/o  Vitals:   11/13/17 1026 11/13/17 1618 11/13/17 2204 11/14/17 0629  BP: (!) 151/93 139/88 (!) 173/105 (!) 160/104  Pulse: 95 (!) 102 (!) 103 98  Resp: 18 18 18 16   Temp: 98.5 F (36.9 C) 98 F (36.7 C) 98.2 F (36.8 C) 98.2 F (36.8 C)  TempSrc: Oral Oral Oral Oral  SpO2: 95% 92% 93% 100%  Weight:   70.1 kg (154 lb 8.7 oz)   Height:        Inpatient medications: . amLODipine  10 mg Oral Daily  . aspirin EC  162 mg Oral Daily  . calcitRIOL  0.25 mcg Oral Q M,W,F  . calcium acetate  1,334 mg Oral TID WC  . Chlorhexidine Gluconate Cloth  6 each Topical Q0600  . clopidogrel  75 mg Oral Daily  . doxazosin  8 mg Oral Daily  . heparin  5,000 Units Subcutaneous Q8H  . hydrALAZINE  25 mg Oral Q8H  . mupirocin ointment  1 application Nasal BID   . sodium chloride     sodium chloride, acetaminophen **OR** acetaminophen, heparin, lidocaine-prilocaine, lip balm, ondansetron **OR** ondansetron (ZOFRAN) IV, pentafluoroprop-tetrafluoroeth  Exam: Gen alert, no distress No jvd or bruits Chest clear bilat RRR 2/6 sem llsb, no RG Abd soft ntnd no mass or ascites +bs MS no joint effusions or deformity Ext no LE edema, no wounds or ulcers Neuro is alert, Ox 3 , nf LFA aVF+bruit    Dialysis:  was getting HD in [place] since July 2018 per patient; LFA AVF      Impression: 1  ESRD - pt was accepted at the [facility] HD unit on [address] in [place], [place], but reportedly he needs to stay in [place] per the parole officer.  Have initiated the CLIP process for outpt HD in [place].  Avoid heparin use, pt has hx of prolonged bleeding from needle sticks that resolved when heparin was stopped.  2  Vol overload / pulm edema - 4L off yest, plan another 4L today as tolerated. BP's still up  3  HTN - bp meds per primary 4  Hx CABG 5  Anemia of ckd - Hb 11, no esa 6  Homelessness - released from prison last this week , consulted  SW   Plan - HD today, CLIP   Kelly Splinter MD [facility] pager [phone]   11/14/2017, 10:49 AM   Recent Labs  Lab 11/11/17 2049 11/12/17 2130 11/13/17 0348  NA 135 135 137  K 5.3* 5.2* 3.7  CL 97* 97* 98*  CO2 19* 20* 26  GLUCOSE 107* 108* 161*  BUN 70* 105* 37*  CREATININE 13.80* 16.15* 8.28*  CALCIUM 8.6* 7.9* 8.1*  PHOS  --  7.3*  --    Recent Labs  Lab 11/12/17 2130  ALBUMIN 2.9*   Recent Labs  Lab 11/11/17 2049 11/12/17 2207  WBC 9.7 6.8  HGB 11.1* 8.8*  HCT 33.5* 26.9*  MCV 90.3 89.4  PLT 148* 114*   Iron/TIBC/Ferritin/ %Sat    Component Value Date/Time   IRON 40 (L) 07/03/2016 1805   TIBC 196 (L) 07/03/2016 1805   FERRITIN 305 07/03/2016 1805   IRONPCTSAT 20 07/03/2016 1805

## 2017-11-15 ENCOUNTER — Inpatient Hospital Stay (HOSPITAL_COMMUNITY): Payer: Medicare Other

## 2017-11-15 LAB — BASIC METABOLIC PANEL
Anion gap: 13 (ref 5–15)
BUN: 30 mg/dL — ABNORMAL HIGH (ref 6–20)
CALCIUM: 8.8 mg/dL — AB (ref 8.9–10.3)
CO2: 26 mmol/L (ref 22–32)
CREATININE: 7.69 mg/dL — AB (ref 0.61–1.24)
Chloride: 95 mmol/L — ABNORMAL LOW (ref 101–111)
GFR, EST AFRICAN AMERICAN: 8 mL/min — AB (ref >60)
GFR, EST NON AFRICAN AMERICAN: 7 mL/min — AB (ref >60)
Glucose, Bld: 125 mg/dL — ABNORMAL HIGH (ref 65–99)
Potassium: 4.1 mmol/L (ref 3.5–5.1)
SODIUM: 134 mmol/L — AB (ref 135–145)

## 2017-11-15 LAB — CBC
HEMATOCRIT: 30.5 % — AB (ref 39.0–52.0)
Hemoglobin: 10.4 g/dL — ABNORMAL LOW (ref 13.0–17.0)
MCH: 30.2 pg (ref 26.0–34.0)
MCHC: 34.1 g/dL (ref 30.0–36.0)
MCV: 88.7 fL (ref 78.0–100.0)
PLATELETS: 135 10*3/uL — AB (ref 150–400)
RBC: 3.44 MIL/uL — ABNORMAL LOW (ref 4.22–5.81)
RDW: 14.6 % (ref 11.5–15.5)
WBC: 6.2 10*3/uL (ref 4.0–10.5)

## 2017-11-15 MED ORDER — RENA-VITE PO TABS
1.0000 | ORAL_TABLET | Freq: Every day | ORAL | Status: DC
  Administered 2017-11-15 – 2017-11-17 (×3): 1 via ORAL
  Filled 2017-11-15 (×3): qty 1

## 2017-11-15 NOTE — Progress Notes (Signed)
HD tx initiated via 15Gx2 w/o problem, pull/push/flush well w/o problem, VSS w/ increased BP, will cont to monitor while on HD tx

## 2017-11-15 NOTE — Progress Notes (Addendum)
[facility] Progress Note  Subjective: HD yesterday Net UF 5L. No c/os. Awaiting spot for outpatient HD in [place].   Vitals:   11/14/17 2340 11/14/17 2359 11/15/17 0040 11/15/17 0509  BP: (!) 162/109 (!) 165/102 (!) 163/97 129/88  Pulse: 99 98  96  Resp: 16 16    Temp:  98.1 F (36.7 C) 98.2 F (36.8 C) 99.1 F (37.3 C)  TempSrc:  Oral  Oral  SpO2: 98% 100% 100% 96%  Weight:  74.2 kg (163 lb 9.3 oz)    Height:        Inpatient medications: . amLODipine  10 mg Oral Daily  . aspirin EC  162 mg Oral Daily  . calcitRIOL  0.25 mcg Oral Q M,W,F  . calcium acetate  1,334 mg Oral TID WC  . Chlorhexidine Gluconate Cloth  6 each Topical Q0600  . clopidogrel  75 mg Oral Daily  . doxazosin  8 mg Oral Daily  . heparin  5,000 Units Subcutaneous Q8H  . heparin  100 Units/kg Dialysis Once in dialysis  . hydrALAZINE  25 mg Oral Q8H  . mupirocin ointment  1 application Nasal BID   . sodium chloride     sodium chloride, acetaminophen **OR** acetaminophen, heparin, lidocaine-prilocaine, lip balm, ondansetron **OR** ondansetron (ZOFRAN) IV, pentafluoroprop-tetrafluoroeth  Exam: Gen alert, no distress No jvd or bruits Chest clear bilat RRR 2/6 sem llsb, no RG Abd soft ntnd no mass or ascites +bs MS no joint effusions or deformity Ext no LE edema, no wounds or ulcers Neuro is alert, Ox 3 , nf LFA aVF+bruit    Dialysis:  was getting HD in [place] since July 2018 per patient; LFA AVF   Impression: 1  ESRD - pt was dc'd from prison last week and was accepted at the [facility] HD unit on [address] in [place], [place], but reportedly he needs to stay in [place] per the parole officer.  Have initiated the CLIP process for outpt HD in [place].  Avoid heparin use, pt has hx of prolonged bleeding from needle sticks that resolved when heparin was stopped. TTS schedule here. Next HD 4/23 2  Vol overload / pulm edema - BP improved with UF. Net 9L with HD Friday and Saturday.  Repeat  the CXR.  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  Kelly Splinter MD [facility] pager [phone]    cell [phone] 11/15/2017, 12:42 PM       Recent Labs  Lab 11/12/17 2130 11/13/17 0348 11/14/17 2025 11/15/17 0414  NA 135 137 133* 134*  K 5.2* 3.7 5.1 4.1  CL 97* 98* 95* 95*  CO2 20* 26 23 26   GLUCOSE 108* 161* 114* 125*  BUN 105* 37* 79* 30*  CREATININE 16.15* 8.28* 13.03* 7.69*  CALCIUM 7.9* 8.1* 8.2* 8.8*  PHOS 7.3*  --  5.9*  --    Recent Labs  Lab 11/12/17 2130 11/14/17 2025  ALBUMIN 2.9* 2.9*   Recent Labs  Lab 11/12/17 2207 11/14/17 2025 11/15/17 0414  WBC 6.8 6.6 6.2  HGB 8.8* 8.7* 10.4*  HCT 26.9* 25.7* 30.5*  MCV 89.4 87.4 88.7  PLT 114* 121* 135*   Iron/TIBC/Ferritin/ %Sat    Component Value Date/Time   IRON 40 (L) 07/03/2016 1805   TIBC 196 (L) 07/03/2016 1805   FERRITIN 305 07/03/2016 1805   IRONPCTSAT 20 07/03/2016 1805

## 2017-11-15 NOTE — Progress Notes (Signed)
HD tx completed _0  w/o problem, a challenged UF goal met, blood rinsed back, VSS w/ still increased bp, report called to Tawni Carnes, RN

## 2017-11-15 NOTE — Progress Notes (Signed)
PROGRESS NOTE    Nicholas Roach  NAT:[id number] DOB: 1950-11-13 DOA: 11/11/2017 [facility]: [facility]    Brief Narrative:   67 year old male with ESRD on hemodialysis (Tu, th sat) at [facility] in [place], hep C, hypertension, active cocaine use (last used 3 days back) who had his last hemodialysis on 4/15.  Patient recently got out of prison and had his dialysis in [place] on Monday (4/15) but has been told by his parole officer that he needs to stay in [place].  He was trying to arrange getting back to [place] does not have a local hemodialysis center. Last evening he became short of breath with cough with whitish phlegm and chest tightness.  Denied any fevers, headache, chills, nausea, vomiting, abdominal pain, dysuria (makes minimal urine), diarrhea.  He felt that during his last dialysis they did not take enough fluid out. In the ED is hypertensive with systolic blood pressure in 427C and diastolic blood pressure in low 100s, hypoxic on room air to 88%, mildly tachycardic and tachypneic.  Labs showed potassium of 5.3, BUN of 70 and creatinine of 13.8.  X-ray showing acute pulmonary edema and focal consolidation of right lower lobe. Patient admitted for acute pulmonary edema with fluid overload needing dialysis.   Assessment & Plan:   Principal Problem:   Acute pulmonary edema (HCC) Active Problems:   ESRD (end stage renal disease) on dialysis ([place])   Renovascular hypertension   Fluid overload   End stage renal disease ([place])   1. Acute pulmonary edema.  Secondary to volume overload with end-stage renal disease.  Patient underwent hemodialysis with removal of 4 L of fluid on first treatment and 5 L of fluid on 4/20.  Respiratory status has been stable.  Breathing comfortably on room air at this time. 2. End-stage renal disease on hemodialysis.  Arrangements are being made for patient to be set up with an outpatient dialysis center in [place].  He was previously set up with  a dialysis Center in [facility], but he will not be returning to that area.  Further dialysis treatments per nephrology 3. Hypertension.  Continue on amlodipine and hydralazine.  Coreg on hold given recent cocaine use.  Blood pressure stable.  If begins to run high, can titrate hydralazine. 4. Possible coronary artery disease.  Continue on aspirin and Plavix. 5. Anemia of chronic kidney disease.  Hemoglobin appears to be near baseline.  No signs of GI bleeding.  Continue to monitor. 6. Hyperkalemia.  Improved after dialysis.   DVT prophylaxis: Heparin Code Status: Full code Family Communication: No family present Disposition Plan: Discharge home once arrangements for outpatient dialysis have been completed   Consultants:   Nephrology  Procedures:     Antimicrobials:      Subjective: No chest pain or shortness of breath.  Objective: Vitals:   11/14/17 2359 11/15/17 0040 11/15/17 0509 11/15/17 1124  BP: (!) 165/102 (!) 163/97 129/88 (!) 140/102  Pulse: 98  96 (!) 107  Resp: 16   18  Temp: 98.1 F (36.7 C) 98.2 F (36.8 C) 99.1 F (37.3 C) 98.5 F (36.9 C)  TempSrc: Oral  Oral Oral  SpO2: 100% 100% 96% 97%  Weight: 74.2 kg (163 lb 9.3 oz)     Height:        Intake/Output Summary (Last 24 hours) at 11/15/2017 1528 Last data filed at 11/15/2017 1356 Gross per 24 hour  Intake 480 ml  Output 5002 ml  Net -4522 ml   Danley Danker  Weights   11/13/17 2204 11/14/17 1930 11/14/17 2359  Weight: 70.1 kg (154 lb 8.7 oz) 79.2 kg (174 lb 9.7 oz) 74.2 kg (163 lb 9.3 oz)    Examination:  General exam: Alert, awake, oriented x 3 Respiratory system: Clear to auscultation. Respiratory effort normal. Cardiovascular system:RRR. No murmurs, rubs, gallops. Gastrointestinal system: Abdomen is nondistended, soft and nontender. No organomegaly or masses felt. Normal bowel sounds heard. Central nervous system: Alert and oriented. No focal neurological deficits. Extremities: No  C/C/E, +pedal pulses Skin: No rashes, lesions or ulcers Psychiatry: Judgement and insight appear normal. Mood & affect appropriate.   Data Reviewed: I have personally reviewed following labs and imaging studies  CBC: Recent Labs  Lab 11/11/17 2049 11/12/17 2207 11/14/17 2025 11/15/17 0414  WBC 9.7 6.8 6.6 6.2  HGB 11.1* 8.8* 8.7* 10.4*  HCT 33.5* 26.9* 25.7* 30.5*  MCV 90.3 89.4 87.4 88.7  PLT 148* 114* 121* 235*   Basic Metabolic Panel: Recent Labs  Lab 11/11/17 2049 11/12/17 2130 11/13/17 0348 11/14/17 2025 11/15/17 0414  NA 135 135 137 133* 134*  K 5.3* 5.2* 3.7 5.1 4.1  CL 97* 97* 98* 95* 95*  CO2 19* 20* 26 23 26   GLUCOSE 107* 108* 161* 114* 125*  BUN 70* 105* 37* 79* 30*  CREATININE 13.80* 16.15* 8.28* 13.03* 7.69*  CALCIUM 8.6* 7.9* 8.1* 8.2* 8.8*  PHOS  --  7.3*  --  5.9*  --    GFR: Estimated Creatinine Clearance: 9.6 mL/min (A) (by C-G formula based on SCr of 7.69 mg/dL (H)). Liver Function Tests: Recent Labs  Lab 11/12/17 2130 11/14/17 2025  ALBUMIN 2.9* 2.9*   No results for input(s): LIPASE, AMYLASE in the last 168 hours. No results for input(s): AMMONIA in the last 168 hours. Coagulation Profile: No results for input(s): INR, PROTIME in the last 168 hours. Cardiac Enzymes: No results for input(s): CKTOTAL, CKMB, CKMBINDEX, TROPONINI in the last 168 hours. BNP (last 3 results) No results for input(s): PROBNP in the last 8760 hours. HbA1C: No results for input(s): HGBA1C in the last 72 hours. CBG: Recent Labs  Lab 11/12/17 0126  GLUCAP 82   Lipid Profile: No results for input(s): CHOL, HDL, LDLCALC, TRIG, CHOLHDL, LDLDIRECT in the last 72 hours. Thyroid Function Tests: No results for input(s): TSH, T4TOTAL, FREET4, T3FREE, THYROIDAB in the last 72 hours. Anemia Panel: No results for input(s): VITAMINB12, FOLATE, FERRITIN, TIBC, IRON, RETICCTPCT in the last 72 hours. Sepsis Labs: No results for input(s): PROCALCITON, LATICACIDVEN in the  last 168 hours.  Recent Results (from the past 240 hour(s))  MRSA PCR Screening     Status: Abnormal   Collection Time: 11/13/17  2:39 AM  Result Value Ref Range Status   MRSA by PCR POSITIVE (A) NEGATIVE Final    Comment:        The GeneXpert MRSA Assay (FDA approved for NASAL specimens only), is one component of a comprehensive MRSA colonization surveillance program. It is not intended to diagnose MRSA infection nor to guide or monitor treatment for MRSA infections. RESULT CALLED TO, READ BACK BY AND VERIFIED WITH: T [facility] RN 11/13/17 5732 JDW Performed at [facility], [place] [address], [place], [place] [address]          Radiology Studies: No results found.      Scheduled Meds: . amLODipine  10 mg Oral Daily  . aspirin EC  162 mg Oral Daily  . calcitRIOL  0.25 mcg Oral Q M,W,F  .  calcium acetate  1,334 mg Oral TID WC  . Chlorhexidine Gluconate Cloth  6 each Topical Q0600  . clopidogrel  75 mg Oral Daily  . doxazosin  8 mg Oral Daily  . heparin  5,000 Units Subcutaneous Q8H  . heparin  100 Units/kg Dialysis Once in dialysis  . hydrALAZINE  25 mg Oral Q8H  . multivitamin  1 tablet Oral QHS  . mupirocin ointment  1 application Nasal BID   Continuous Infusions: . sodium chloride       LOS: 2 days     Kathie Dike, MD [facility] Pager [phone]  If 7PM-7AM, please contact night-coverage [email or web address] Password [facility] 11/15/2017, 3:28 PM

## 2017-11-16 NOTE — Progress Notes (Signed)
[facility] PROGRESS NOTE  Nicholas Roach YQI:[id number] DOB: Oct 03, 1950 DOA: 11/11/2017 PCP: [facility]  Brief summary   67 year old male with ESRD on hemodialysis (Tu, th sat) at [employer], hep C, hypertension, active cocaine use (last used 3 days back) who had his last hemodialysis on 4/15.Patient recently got out of prison and had his dialysis in [place] on Monday (4/15)but has been told by his parole officer that he needs to stay in [place]. He was trying to arrange gettingback to [facility] not have a local hemodialysis center. Last evening he became short of breath with cough with whitish phlegm and chest tightness. Denied any fevers, headache, chills, nausea, vomiting, abdominal pain, dysuria (makes minimal urine), diarrhea. He felt that during his last dialysis they did not take enough fluid out. In the ED is hypertensive with systolic blood pressure in 387F and diastolic blood pressure in low 100s, hypoxic on room air to 88%, mildly tachycardic and tachypneic. Labs showed potassium of 5.3, BUN of 70 and creatinine of 13.8. X-ray showing acute pulmonary edema and focal consolidation of right lower lobe. Patient admitted for acute pulmonary edema with fluid overload needing dialysis.     Assessment/Plan:  1. Acute pulmonary edema.  Secondary to volume overload with end-stage renal disease.  Patient underwent hemodialysis with removal of 4 L of fluid on first treatment and 5 L of fluid on 4/20.  Respiratory status has been stable.  Breathing comfortably on room air at this time. Resolved.  2. End-stage renal disease on hemodialysis.  Arrangements are being made for patient to be set up with an outpatient dialysis center in [place].  He was previously set up with a dialysis Center in [facility], but he will not be returning to that area.  Further dialysis treatments per nephrology. Awaiting outpatient HD arrangement   3. Hypertension.  Continue on amlodipine and hydralazine.  Coreg on hold given recent cocaine use.  Blood pressure stable.  If begins to run high, can titrate hydralazine. 4. Possible coronary artery disease.  Continue on aspirin and Plavix. 5. Anemia of chronic kidney disease.  Hemoglobin appears to be near baseline.  No signs of GI bleeding.  Continue to monitor. 6. Hyperkalemia.  Improved after dialysis. 7. Homelessness. Cont per SW     Code Status: full Family Communication: d/w patient, Therapist, sports (indicate person spoken with, relationship, and if by phone, the number) Disposition Plan: awaiting HD arrangement    Consultants:  Nephrology   Procedures:  HD  Antibiotics:  none (indicate start date, and stop date if known)  HPI/Subjective: Reports feeling well. No acute issues, awaiting HD arrangement   Objective: Vitals:   11/15/17 2010 11/16/17 0346  BP: (!) 157/96 (!) 155/91  Pulse: (!) 101 88  Resp:    Temp: 98 F (36.7 C) 98.5 F (36.9 C)  SpO2: 99% 96%    Intake/Output Summary (Last 24 hours) at 11/16/2017 0906 Last data filed at 11/16/2017 0200 Gross per 24 hour  Intake 240 ml  Output 0 ml  Net 240 ml   Filed Weights   11/14/17 1930 11/14/17 2359 11/16/17 0031  Weight: 79.2 kg (174 lb 9.7 oz) 74.2 kg (163 lb 9.3 oz) 74 kg (163 lb 2.3 oz)    Exam:   General:  No distress   Cardiovascular: s1,s2 rrr  Respiratory: CTA BL  Abdomen: soft, nt   Musculoskeletal: no leg edema    Data Reviewed: Basic Metabolic Panel: Recent Labs  Lab 11/11/17 2049 11/12/17  2130 11/13/17 0348 11/14/17 2025 11/15/17 0414  NA 135 135 137 133* 134*  K 5.3* 5.2* 3.7 5.1 4.1  CL 97* 97* 98* 95* 95*  CO2 19* 20* 26 23 26   GLUCOSE 107* 108* 161* 114* 125*  BUN 70* 105* 37* 79* 30*  CREATININE 13.80* 16.15* 8.28* 13.03* 7.69*  CALCIUM 8.6* 7.9* 8.1* 8.2* 8.8*  PHOS  --  7.3*  --  5.9*  --    Liver Function Tests: Recent Labs  Lab 11/12/17 2130 11/14/17 2025   ALBUMIN 2.9* 2.9*   No results for input(s): LIPASE, AMYLASE in the last 168 hours. No results for input(s): AMMONIA in the last 168 hours. CBC: Recent Labs  Lab 11/11/17 2049 11/12/17 2207 11/14/17 2025 11/15/17 0414  WBC 9.7 6.8 6.6 6.2  HGB 11.1* 8.8* 8.7* 10.4*  HCT 33.5* 26.9* 25.7* 30.5*  MCV 90.3 89.4 87.4 88.7  PLT 148* 114* 121* 135*   Cardiac Enzymes: No results for input(s): CKTOTAL, CKMB, CKMBINDEX, TROPONINI in the last 168 hours. BNP (last 3 results) No results for input(s): BNP in the last 8760 hours.  ProBNP (last 3 results) No results for input(s): PROBNP in the last 8760 hours.  CBG: Recent Labs  Lab 11/12/17 0126  GLUCAP 82    Recent Results (from the past 240 hour(s))  MRSA PCR Screening     Status: Abnormal   Collection Time: 11/13/17  2:39 AM  Result Value Ref Range Status   MRSA by PCR POSITIVE (A) NEGATIVE Final    Comment:        The GeneXpert MRSA Assay (FDA approved for NASAL specimens only), is one component of a comprehensive MRSA colonization surveillance program. It is not intended to diagnose MRSA infection nor to guide or monitor treatment for MRSA infections. RESULT CALLED TO, READ BACK BY AND VERIFIED WITH: T [facility] RN 11/13/17 8119 JDW Performed at [facility], [place] [address], [place], [place] [address]      Studies: Dg Chest 2 View  Result Date: 11/15/2017 CLINICAL DATA:  Renal failure patient recently admitted with pulmonary edema. EXAM: CHEST - 2 VIEW COMPARISON:  PA and lateral chest 11/11/2017. FINDINGS: Pulmonary edema has resolved. Small to moderate right pleural effusion and basilar airspace disease appear mildly improved. There is cardiomegaly. No pneumothorax. IMPRESSION: Resolved pulmonary edema. Small to moderate right pleural effusion and basilar airspace disease have mildly improved. Electronically Signed   By: Inge Rise M.D.   On: 11/15/2017 15:57    Scheduled Meds: . amLODipine  10 mg Oral  Daily  . aspirin EC  162 mg Oral Daily  . calcitRIOL  0.25 mcg Oral Q M,W,F  . calcium acetate  1,334 mg Oral TID WC  . Chlorhexidine Gluconate Cloth  6 each Topical Q0600  . clopidogrel  75 mg Oral Daily  . doxazosin  8 mg Oral Daily  . heparin  5,000 Units Subcutaneous Q8H  . heparin  100 Units/kg Dialysis Once in dialysis  . hydrALAZINE  25 mg Oral Q8H  . multivitamin  1 tablet Oral QHS  . mupirocin ointment  1 application Nasal BID   Continuous Infusions: . sodium chloride      Principal Problem:   Acute pulmonary edema (HCC) Active Problems:   ESRD (end stage renal disease) on dialysis (HCC)   Renovascular hypertension   Fluid overload   End stage renal disease (HCC)    Time spent: >35 minutes     Grey Schlauch N  [facility] Pager [phone]. If 7PM-7AM, please contact night-coverage at [email or web address], password [facility] 11/16/2017, 9:06 AM  LOS: 3 days

## 2017-11-16 NOTE — Progress Notes (Signed)
[name] Progress Note   Subjective:   States he is doing well. Ready to be d/c.   Objective Vitals:   11/15/17 2010 11/16/17 0031 11/16/17 0346 11/16/17 0957  BP: (!) 157/96  (!) 155/91 (!) 168/95  Pulse: (!) 101  88 98  Resp:    18  Temp: 98 F (36.7 C)  98.5 F (36.9 C) 97.6 F (36.4 C)  TempSrc: Oral  Oral Oral  SpO2: 99%  96% 100%  Weight:  74 kg (163 lb 2.3 oz)    Height:       Physical Exam General:WDWN, NAD, well appearing male laying in bed Heart: RRR, +7/5 systolic murmur, no rub or gallop Lungs:CTAB, breath sounds decreased on R Abdomen:soft, NTND, +BS Extremities:no LE edema Dialysis Access: LU AVF +b/t   Filed Weights   11/14/17 1930 11/14/17 2359 11/16/17 0031  Weight: 79.2 kg (174 lb 9.7 oz) 74.2 kg (163 lb 9.3 oz) 74 kg (163 lb 2.3 oz)    Intake/Output Summary (Last 24 hours) at 11/16/2017 1113 Last data filed at 11/16/2017 1000 Gross per 24 hour  Intake 240 ml  Output 0 ml  Net 240 ml    Additional Objective Labs: Basic Metabolic Panel: Recent Labs  Lab 11/12/17 2130 11/13/17 0348 11/14/17 2025 11/15/17 0414  NA 135 137 133* 134*  K 5.2* 3.7 5.1 4.1  CL 97* 98* 95* 95*  CO2 20* 26 23 26   GLUCOSE 108* 161* 114* 125*  BUN 105* 37* 79* 30*  CREATININE 16.15* 8.28* 13.03* 7.69*  CALCIUM 7.9* 8.1* 8.2* 8.8*  PHOS 7.3*  --  5.9*  --    Liver Function Tests: Recent Labs  Lab 11/12/17 2130 11/14/17 2025  ALBUMIN 2.9* 2.9*   No results for input(s): LIPASE, AMYLASE in the last 168 hours. CBC: Recent Labs  Lab 11/11/17 2049 11/12/17 2207 11/14/17 2025 11/15/17 0414  WBC 9.7 6.8 6.6 6.2  HGB 11.1* 8.8* 8.7* 10.4*  HCT 33.5* 26.9* 25.7* 30.5*  MCV 90.3 89.4 87.4 88.7  PLT 148* 114* 121* 135*   CBG: Recent Labs  Lab 11/12/17 0126  GLUCAP 82   Studies/Results: Dg Chest 2 View  Result Date: 11/15/2017 CLINICAL DATA:  Renal failure patient recently admitted with pulmonary edema. EXAM: CHEST - 2 VIEW  COMPARISON:  PA and lateral chest 11/11/2017. FINDINGS: Pulmonary edema has resolved. Small to moderate right pleural effusion and basilar airspace disease appear mildly improved. There is cardiomegaly. No pneumothorax. IMPRESSION: Resolved pulmonary edema. Small to moderate right pleural effusion and basilar airspace disease have mildly improved. Electronically Signed   By: Nicholas Roach M.D.   On: 11/15/2017 15:57    Medications: . sodium chloride     . amLODipine  10 mg Oral Daily  . aspirin EC  162 mg Oral Daily  . calcitRIOL  0.25 mcg Oral Q M,W,F  . calcium acetate  1,334 mg Oral TID WC  . Chlorhexidine Gluconate Cloth  6 each Topical Q0600  . clopidogrel  75 mg Oral Daily  . doxazosin  8 mg Oral Daily  . heparin  5,000 Units Subcutaneous Q8H  . heparin  100 Units/kg Dialysis Once in dialysis  . hydrALAZINE  25 mg Oral Q8H  . multivitamin  1 tablet Oral QHS  . mupirocin ointment  1 application Nasal BID    Dialysis Summary: Getting HD in [place] since July 2018 per patient; LFA AVF Released from prison last week and accepted at [facility] HD unit in [place] but reports needing to stay in [place] per parole officer.CLIP process started for [place] unit.   Assessment/Plan: 1. Volume overload/pulm edema - resolved per CXR on 4/21. Total of 9L removed.  Continue to remove volume as tolerated. Standing weights to ensure accuracy.   2. ESRD - Continue on TTS schedule while admitted.  K 4.1. Orders written for tomorrow. Avoid heparin d/t prolonged bleeding after cannulation which resolved when heparin was d/c. 3. Anemia of CKD- Hgb 10.4, no ESA indicated.  4. Secondary hyperparathyroidism - Ca in goal, P improved to 5.9.. Continue VDRA and binders.  5. HTN - BP elevated today. Improves with HD.  On  Hydralazine, doxazosin & amlodipine.   6. Nutrition - Alb 2.9. Renal diet w/fluid restrictions. Renavite.Nepro. 7. Hx CABG 8. Homelessness - recently released from prison, SW consulted.     [name], PA-C [facility] Pager: [phone] 11/16/2017,11:13 AM  LOS: 3 days

## 2017-11-17 LAB — RENAL FUNCTION PANEL
ALBUMIN: 3.2 g/dL — AB (ref 3.5–5.0)
ANION GAP: 17 — AB (ref 5–15)
BUN: 79 mg/dL — ABNORMAL HIGH (ref 6–20)
CO2: 20 mmol/L — ABNORMAL LOW (ref 22–32)
Calcium: 8.8 mg/dL — ABNORMAL LOW (ref 8.9–10.3)
Chloride: 95 mmol/L — ABNORMAL LOW (ref 101–111)
Creatinine, Ser: 14.58 mg/dL — ABNORMAL HIGH (ref 0.61–1.24)
GFR calc Af Amer: 3 mL/min — ABNORMAL LOW (ref >60)
GFR calc non Af Amer: 3 mL/min — ABNORMAL LOW (ref >60)
Glucose, Bld: 93 mg/dL (ref 65–99)
PHOSPHORUS: 6.5 mg/dL — AB (ref 2.5–4.6)
POTASSIUM: 5.1 mmol/L (ref 3.5–5.1)
Sodium: 132 mmol/L — ABNORMAL LOW (ref 135–145)

## 2017-11-17 LAB — CBC
HCT: 27 % — ABNORMAL LOW (ref 39.0–52.0)
HEMOGLOBIN: 9.2 g/dL — AB (ref 13.0–17.0)
MCH: 29.6 pg (ref 26.0–34.0)
MCHC: 34.1 g/dL (ref 30.0–36.0)
MCV: 86.8 fL (ref 78.0–100.0)
Platelets: 133 10*3/uL — ABNORMAL LOW (ref 150–400)
RBC: 3.11 MIL/uL — AB (ref 4.22–5.81)
RDW: 14.4 % (ref 11.5–15.5)
WBC: 7 10*3/uL (ref 4.0–10.5)

## 2017-11-17 MED ORDER — ALTEPLASE 2 MG IJ SOLR: 2.0000 mg | Freq: Once | INTRAMUSCULAR | Status: DC | PRN

## 2017-11-17 MED ORDER — LIDOCAINE-PRILOCAINE 2.5-2.5 % EX CREA: 1.0000 "application " | TOPICAL_CREAM | CUTANEOUS | Status: DC | PRN

## 2017-11-17 MED ORDER — SODIUM CHLORIDE 0.9 % IV SOLN: 100.0000 mL | INTRAVENOUS | Status: DC | PRN

## 2017-11-17 MED ORDER — PENTAFLUOROPROP-TETRAFLUOROETH EX AERO: 1.0000 "application " | INHALATION_SPRAY | CUTANEOUS | Status: DC | PRN

## 2017-11-17 MED ORDER — HYDRALAZINE HCL 50 MG PO TABS
50.0000 mg | ORAL_TABLET | Freq: Four times a day (QID) | ORAL | Status: DC
  Administered 2017-11-17 – 2017-11-18 (×3): 50 mg via ORAL
  Filled 2017-11-17 (×3): qty 1

## 2017-11-17 MED ORDER — LIDOCAINE HCL (PF) 1 % IJ SOLN: 5.0000 mL | INTRAMUSCULAR | Status: DC | PRN

## 2017-11-17 MED ORDER — HEPARIN SODIUM (PORCINE) 1000 UNIT/ML DIALYSIS: 1000.0000 [IU] | INTRAMUSCULAR | Status: DC | PRN

## 2017-11-17 NOTE — Progress Notes (Addendum)
PROGRESS NOTE    Nicholas Roach  AYT:[id number] DOB: [birth year] DOA: 11/11/2017 [facility]CP: [facility]  Brief Narrative: 67 year old male with ESRD on hemodialysis (Tu, th sat) at [employer], hep C, hypertension, active cocaine use (last used 3 days back) who had his last hemodialysis on 4/15.Patient recently got out of prison and had his dialysis in [place] on Monday (4/15)but has been told by his parole officer that he needs to stay in [place]. He was trying to arrange gettingback to [facility] not have a local hemodialysis center. Last evening he became short of breath with cough with whitish phlegm and chest tightness. Denied any fevers, headache, chills, nausea, vomiting, abdominal pain, dysuria (makes minimal urine), diarrhea. He felt that during his last dialysis they did not take enough fluid out. In the ED is hypertensive with systolic blood pressure in 355D and diastolic blood pressure in low 100s, hypoxic on room air to 88%, mildly tachycardic and tachypneic. Labs showed potassium of 5.3, BUN of 70 and creatinine of 13.8. X-ray showing acute pulmonary edema and focal consolidation of right lower lobe. Patient admitted for acute pulmonary edema with fluid overload needing hd.    Assessment & Plan:   Principal Problem:   Acute pulmonary edema (HCC) Active Problems:   ESRD (end stage renal disease) on dialysis ([place])   Renovascular hypertension   Fluid overload   End stage renal disease ([place])  1. Acute pulmonary edema.  Secondary to volume overload with end-stage renal disease.  Patient underwent hemodialysis with removal of 4 L of fluid on first treatment and 5 L of fluid on 4/20.  Respiratory status has been stable.  Breathing comfortably on room air at this time. 2. End-stage renal disease on hemodialysis.  Arrangements are being made for patient to be set up with an outpatient dialysis center in [place].  He was previously set up with a dialysis  Center in [facility], but he will not be returning to that area.  Further dialysis treatments per nephrology.CLIPP in progress. 3. Hypertension.  Continue on amlodipine and hydralazine.  Coreg on hold given recent cocaine use.  Blood pressure remains high increase hydralazine to 50 q. 6.   4. Possible coronary artery disease.  Continue on aspirin and Plavix. 5. Anemia of chronic kidney disease.  Hemoglobin appears to be near baseline.  No signs of GI bleeding.  Continue to monitor. 6. Hyperkalemia.  Improved after dialysis     DVT prophylaxis: Heparin Code Status full code Family Communication: No family available Disposition Plan: Discharge once arrangements made for outpatient hemodialysis Consultants: Nephrology Procedures: None Antimicrobials: None Subjective: No specific complaints  Objective: Vitals:   11/17/17 1030 11/17/17 1100 11/17/17 1126 11/17/17 1245  BP: (!) 143/66 129/78 139/73 (!) 143/101  Pulse: 89 92 77 97  Resp:   18 18  Temp:   98 F (36.7 C) 98.1 F (36.7 C)  TempSrc:   Oral Oral  SpO2:   96% 100%  Weight:   73 kg (160 lb 15 oz)   Height:        Intake/Output Summary (Last 24 hours) at 11/17/2017 1428 Last data filed at 11/17/2017 1136 Gross per 24 hour  Intake 480 ml  Output 3903 ml  Net -3423 ml   Filed Weights   11/16/17 2132 11/17/17 0720 11/17/17 1126  Weight: 73.9 kg (163 lb) 76.9 kg (169 lb 8.5 oz) 73 kg (160 lb 15 oz)    Examination:  General exam: Appears calm and comfortable  Respiratory system: Clear to auscultation. Respiratory effort normal. Cardiovascular system: S1 & S2 heard, RRR. No JVD, murmurs, rubs, gallops or clicks. No pedal edema. Gastrointestinal system: Abdomen is nondistended, soft and nontender. No organomegaly or masses felt. Normal bowel sounds heard. Central nervous system: Alert and oriented. No focal neurological deficits. Extremities: Symmetric 5 x 5 power. Skin: No rashes, lesions or  ulcers Psychiatry: Judgement and insight appear normal. Mood & affect appropriate.     Data Reviewed: I have personally reviewed following labs and imaging studies  CBC: Recent Labs  Lab 11/11/17 2049 11/12/17 2207 11/14/17 2025 11/15/17 0414 11/17/17 0720  WBC 9.7 6.8 6.6 6.2 7.0  HGB 11.1* 8.8* 8.7* 10.4* 9.2*  HCT 33.5* 26.9* 25.7* 30.5* 27.0*  MCV 90.3 89.4 87.4 88.7 86.8  PLT 148* 114* 121* 135* 976*   Basic Metabolic Panel: Recent Labs  Lab 11/12/17 2130 11/13/17 0348 11/14/17 2025 11/15/17 0414 11/17/17 0720  NA 135 137 133* 134* 132*  K 5.2* 3.7 5.1 4.1 5.1  CL 97* 98* 95* 95* 95*  CO2 20* 26 23 26  20*  GLUCOSE 108* 161* 114* 125* 93  BUN 105* 37* 79* 30* 79*  CREATININE 16.15* 8.28* 13.03* 7.69* 14.58*  CALCIUM 7.9* 8.1* 8.2* 8.8* 8.8*  PHOS 7.3*  --  5.9*  --  6.5*   GFR: Estimated Creatinine Clearance: 5.1 mL/min (A) (by C-G formula based on SCr of 14.58 mg/dL (H)). Liver Function Tests: Recent Labs  Lab 11/12/17 2130 11/14/17 2025 11/17/17 0720  ALBUMIN 2.9* 2.9* 3.2*   No results for input(s): LIPASE, AMYLASE in the last 168 hours. No results for input(s): AMMONIA in the last 168 hours. Coagulation Profile: No results for input(s): INR, PROTIME in the last 168 hours. Cardiac Enzymes: No results for input(s): CKTOTAL, CKMB, CKMBINDEX, TROPONINI in the last 168 hours. BNP (last 3 results) No results for input(s): PROBNP in the last 8760 hours. HbA1C: No results for input(s): HGBA1C in the last 72 hours. CBG: Recent Labs  Lab 11/12/17 0126  GLUCAP 82   Lipid Profile: No results for input(s): CHOL, HDL, LDLCALC, TRIG, CHOLHDL, LDLDIRECT in the last 72 hours. Thyroid Function Tests: No results for input(s): TSH, T4TOTAL, FREET4, T3FREE, THYROIDAB in the last 72 hours. Anemia Panel: No results for input(s): VITAMINB12, FOLATE, FERRITIN, TIBC, IRON, RETICCTPCT in the last 72 hours. Sepsis Labs: No results for input(s): PROCALCITON,  LATICACIDVEN in the last 168 hours.  Recent Results (from the past 240 hour(s))  MRSA PCR Screening     Status: Abnormal   Collection Time: 11/13/17  2:39 AM  Result Value Ref Range Status   MRSA by PCR POSITIVE (A) NEGATIVE Final    Comment:        The GeneXpert MRSA Assay (FDA approved for NASAL specimens only), is one component of a comprehensive MRSA colonization surveillance program. It is not intended to diagnose MRSA infection nor to guide or monitor treatment for MRSA infections. RESULT CALLED TO, READ BACK BY AND VERIFIED WITH: T [facility] RN 11/13/17 7341 JDW Performed at [facility], [place] [address] [place], [place] [address]          Radiology Studies: Dg Chest 2 View  Result Date: 11/15/2017 CLINICAL DATA:  Renal failure patient recently admitted with pulmonary edema. EXAM: CHEST - 2 VIEW COMPARISON:  PA and lateral chest 11/11/2017. FINDINGS: Pulmonary edema has resolved. Small to moderate right pleural effusion and basilar airspace disease appear mildly improved. There is cardiomegaly. No pneumothorax. IMPRESSION:  Resolved pulmonary edema. Small to moderate right pleural effusion and basilar airspace disease have mildly improved. Electronically Signed   By: Inge Rise M.D.   On: 11/15/2017 15:57        Scheduled Meds: . amLODipine  10 mg Oral Daily  . aspirin EC  162 mg Oral Daily  . calcitRIOL  0.25 mcg Oral Q M,W,F  . calcium acetate  1,334 mg Oral TID WC  . Chlorhexidine Gluconate Cloth  6 each Topical Q0600  . clopidogrel  75 mg Oral Daily  . doxazosin  8 mg Oral Daily  . heparin  5,000 Units Subcutaneous Q8H  . hydrALAZINE  25 mg Oral Q8H  . multivitamin  1 tablet Oral QHS  . mupirocin ointment  1 application Nasal BID   Continuous Infusions: . sodium chloride       LOS: 4 days     Georgette Shell,  If 7PM-7AM, please contact night-coverage [email or web address] Password [facility] 11/17/2017, 2:28 PM

## 2017-11-17 NOTE — Procedures (Signed)
Tolerating HD, BFR 400, goal 4500cc, BP ok Estanislado Emms, MD

## 2017-11-17 NOTE — Care Management Note (Addendum)
Case Management Note  Patient Details  Name: Nicholas Roach MRN: [id number] Date of Birth: 1950-12-16  Subjective/Objective:    Admitted for Acute pulmonary edema with fluid overload needing dialysis.           Action/Plan: Was getting HD in [place] since July 2018 per patient; LFA AVF Released from prison last week and accepted at [facility] HD unit in [place], [place] but unfortunate circumstance, needs dialysis in [place] (per Research officer, trade union) and has no residence in [place]. CLIP process pending.  Expected Discharge Date:    To be determined             Expected Discharge Plan:  Home/Self Care  In-House Referral:  Clinical Social Work(homelessness)  Discharge planning Services  CM Consult  Status of Service:  In process, will continue to follow  Kristen Cardinal, RN 11/17/2017, 1:44 PM

## 2017-11-18 DIAGNOSIS — J810 Acute pulmonary edema: Principal | ICD-10-CM

## 2017-11-18 MED ORDER — LIDOCAINE HCL (PF) 1 % IJ SOLN: 5.0000 mL | INTRAMUSCULAR | 1 refills | Status: AC | PRN

## 2017-11-18 MED ORDER — SODIUM CHLORIDE 0.9 % IV SOLN: 100.0000 mL | INTRAVENOUS | Status: DC | PRN

## 2017-11-18 MED ORDER — LIDOCAINE-PRILOCAINE 2.5-2.5 % EX CREA: 1.0000 "application " | TOPICAL_CREAM | CUTANEOUS | Status: DC | PRN

## 2017-11-18 MED ORDER — PENTAFLUOROPROP-TETRAFLUOROETH EX AERO: 1.0000 "application " | INHALATION_SPRAY | CUTANEOUS | Status: DC | PRN

## 2017-11-18 MED ORDER — PENTAFLUOROPROP-TETRAFLUOROETH EX AERO: 1.0000 "application " | INHALATION_SPRAY | CUTANEOUS | 0 refills | Status: AC | PRN

## 2017-11-18 MED ORDER — LIDOCAINE-PRILOCAINE 2.5-2.5 % EX CREA: 1.0000 "application " | TOPICAL_CREAM | CUTANEOUS | 0 refills | Status: AC | PRN

## 2017-11-18 MED ORDER — RENA-VITE PO TABS: 1.0000 | ORAL_TABLET | Freq: Every day | ORAL | 0 refills | Status: AC

## 2017-11-18 MED ORDER — HYDRALAZINE HCL 50 MG PO TABS: 50.0000 mg | ORAL_TABLET | Freq: Four times a day (QID) | ORAL | 0 refills | Status: AC

## 2017-11-18 MED ORDER — HEPARIN SODIUM (PORCINE) 1000 UNIT/ML DIALYSIS: 1000.0000 [IU] | INTRAMUSCULAR | Status: DC | PRN

## 2017-11-18 MED ORDER — LIDOCAINE HCL (PF) 1 % IJ SOLN: 5.0000 mL | INTRAMUSCULAR | Status: DC | PRN

## 2017-11-18 NOTE — Progress Notes (Signed)
[facility] Progress Note   Subjective:  Comfortable, No c/os this morning HD yesterday net UF 3.9L  Has outpatient dialysis spot at [facility] TTS    Objective Vitals:   11/17/17 1602 11/17/17 2029 11/17/17 2319 11/18/17 0451  BP: (!) 126/91 (!) 146/92 130/86 (!) 171/114  Pulse: (!) 103 98 (!) 105 (!) 104  Resp: 18     Temp: 99.2 F (37.3 C) 98.6 F (37 C)  98.9 F (37.2 C)  TempSrc: Oral Oral  Oral  SpO2: 97% 95%  99%  Weight:  73 kg (160 lb 15 oz)    Height:       Physical Exam General: WNWD male NAD Heart: RRR 2/6 SEM Lungs: CTAB  Abdomen: soft NT Extremities: no LE edema Dialysis Access: LUE AVF+bruit  Dialysis Summary: Getting HD in [place] since July 2018 per patient; LFA AVF Released from prison last week and accepted at [facility] HD unit in [place], [place] but reports needing to stay in [place] per parole officer.CLIP process started for [place] unit.   Assessment/Plan: 1. Volume overload/pulm edema - resolved per CXR on 4/21. Net 13L since admission   Continue to remove volume as tolerated. Standing weights to ensure accuracy.   2. ESRD - TTS schedule while admitted. CLIP'd to [facility] TTS 2nd shift. Orders written for tomorrow in case still here.  Avoid heparin d/t prolonged bleeding after cannulation which resolved when heparin was d/c. 3. Anemia of CKD- Hgb 9.2, Resume ESA, either here or as outpatient  4. Secondary hyperparathyroidism - . Continue VDRA and binders.  5. HTN - BP elevated today. Improves with HD.  On  Hydralazine, doxazosin & amlodipine.   6. Nutrition - Alb 2.9. Renal diet w/fluid restrictions. Renavite.Nepro. 7. Hx CABG 8. Homelessness - recently released from prison, no residence in [place]. SW working on housing.      Lynnda Child PA-C [facility] Pager [phone] 11/18/2017,8:58 AM  LOS: 5 days   Additional Objective Labs: Basic Metabolic Panel: Recent Labs  Lab 11/12/17 2130   11/14/17 2025 11/15/17 0414 11/17/17 0720  NA 135   < > 133* 134* 132*  K 5.2*   < > 5.1 4.1 5.1  CL 97*   < > 95* 95* 95*  CO2 20*   < > 23 26 20*  GLUCOSE 108*   < > 114* 125* 93  BUN 105*   < > 79* 30* 79*  CREATININE 16.15*   < > 13.03* 7.69* 14.58*  CALCIUM 7.9*   < > 8.2* 8.8* 8.8*  PHOS 7.3*  --  5.9*  --  6.5*   < > = values in this interval not displayed.   CBC: Recent Labs  Lab 11/11/17 2049 11/12/17 2207 11/14/17 2025 11/15/17 0414 11/17/17 0720  WBC 9.7 6.8 6.6 6.2 7.0  HGB 11.1* 8.8* 8.7* 10.4* 9.2*  HCT 33.5* 26.9* 25.7* 30.5* 27.0*  MCV 90.3 89.4 87.4 88.7 86.8  PLT 148* 114* 121* 135* 133*   Blood Culture No results found for: SDES, SPECREQUEST, CULT, REPTSTATUS  Cardiac Enzymes: No results for input(s): CKTOTAL, CKMB, CKMBINDEX, TROPONINI in the last 168 hours. CBG: Recent Labs  Lab 11/12/17 0126  GLUCAP 82   Iron Studies: No results for input(s): IRON, TIBC, TRANSFERRIN, FERRITIN in the last 72 hours. No results found for: INR, PROTIME Medications: . sodium chloride     . amLODipine  10 mg Oral Daily  . aspirin EC  162 mg  Oral Daily  . calcitRIOL  0.25 mcg Oral Q M,W,F  . calcium acetate  1,334 mg Oral TID WC  . clopidogrel  75 mg Oral Daily  . doxazosin  8 mg Oral Daily  . heparin  5,000 Units Subcutaneous Q8H  . hydrALAZINE  50 mg Oral Q6H  . multivitamin  1 tablet Oral QHS  . mupirocin ointment  1 application Nasal BID

## 2017-11-18 NOTE — Discharge Summary (Signed)
Physician Discharge Summary  Nicholas Roach HDQ:[id number] DOB: 1950-12-17 DOA: 11/11/2017  PCP: [facility]  Admit date: 11/11/2017 Discharge date: 11/18/2017  Admitted From:shelter Disposition:  shelter Recommendations for Outpatient Follow-up:  1. Follow up with PCP in 1-2 weeks 2. Please obtain BMP/CBC in one week  Home Health:none Equipment/Devices:none Discharge Condition:stable CODE STATUS full Diet renal Brief/Interim Summary:67 y.o. male with medical history significant of ESRD, HTN, previous cocaine use.  Last full dialysis treatment Monday in [place], pt states that when he left Monday he had almost 2 Liters of fluid that was not removed.  Patient recently got out of prison.  Since Monday patient has since been told he cant go back to dialysis clinic in [place] because his parole terms dont allow him to leave [place] so he needs to find dialysis center in [facility] [place] [facility] which he hasnt yet.    Discharge Diagnoses:  Principal Problem:   Acute pulmonary edema (HCC) Active Problems:   ESRD (end stage renal disease) on dialysis ([place])   Renovascular hypertension   Fluid overload   End stage renal disease ([place])   1. Acute pulmonary edema. Secondary to volume overload with end-stage renal disease. Patient underwent hemodialysis with removal 13 L since admit 2. End-stage renal disease on hemodialysis. Arrangements are made for dialysis at [facility] dialysis center.   3. Hypertension. Continue on amlodipine and hydralazine. 4. Possible coronary artery disease. Continue on aspirin and Plavix. 5. Anemia of chronic kidney disease. Hemoglobin appears to be near baseline. No signs of GI bleeding. Continue to monitor. 6. Hyperkalemia. Improved after dialysis    Discharge Instructions  Discharge Instructions    Call MD for:  difficulty breathing, headache or visual disturbances   Complete by:  As directed    Call MD for:  difficulty  breathing, headache or visual disturbances   Complete by:  As directed    Call MD for:  persistant nausea and vomiting   Complete by:  As directed    Call MD for:  persistant nausea and vomiting   Complete by:  As directed    Call MD for:  severe uncontrolled pain   Complete by:  As directed    Call MD for:  severe uncontrolled pain   Complete by:  As directed    Call MD for:  temperature >100.4   Complete by:  As directed    Diet - low sodium heart healthy   Complete by:  As directed    Diet - low sodium heart healthy   Complete by:  As directed    Increase activity slowly   Complete by:  As directed    Increase activity slowly   Complete by:  As directed      Allergies as of 11/18/2017      Reactions   Lisinopril Other (See Comments)   cough      Medication List    TAKE these medications   amLODipine 10 MG tablet Commonly known as:  NORVASC Take 10 mg by mouth daily.   aspirin EC 81 MG tablet Take 162 mg by mouth daily.   calcitRIOL 0.25 MCG capsule Commonly known as:  ROCALTROL Take 0.25 mcg by mouth every Monday, Wednesday, and Friday.   calcium acetate 667 MG capsule Commonly known as:  PHOSLO Take 1,334 mg by mouth 3 (three) times daily with meals.   carvedilol 3.125 MG tablet Commonly known as:  COREG Take 3.125 mg by mouth 2 (two) times daily.  clopidogrel 75 MG tablet Commonly known as:  PLAVIX Take 75 mg by mouth daily.   doxazosin 8 MG tablet Commonly known as:  CARDURA Take 8 mg by mouth daily.   hydrALAZINE 50 MG tablet Commonly known as:  APRESOLINE Take 1 tablet (50 mg total) by mouth every 6 (six) hours.   multivitamin Tabs tablet Take 1 tablet by mouth at bedtime.      Follow-up Information    [facility] Follow up.   Contact information: [place] [address] [phone]        Roney Jaffe, MD Follow up.   Specialty:  Nephrology Contact information: [place] [address] [phone]          Allergies  Allergen Reactions  . Lisinopril Other (See Comments)    cough    Consultations: nephrology  Procedures/Studies: Dg Chest 2 View  Result Date: 11/15/2017 CLINICAL DATA:  Renal failure patient recently admitted with pulmonary edema. EXAM: CHEST - 2 VIEW COMPARISON:  PA and lateral chest 11/11/2017. FINDINGS: Pulmonary edema has resolved. Small to moderate right pleural effusion and basilar airspace disease appear mildly improved. There is cardiomegaly. No pneumothorax. IMPRESSION: Resolved pulmonary edema. Small to moderate right pleural effusion and basilar airspace disease have mildly improved. Electronically Signed   By: Inge Rise M.D.   On: 11/15/2017 15:57   Dg Chest 2 View  Result Date: 11/11/2017 CLINICAL DATA:  Shortness of breath and chest tightness tonight. 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 EXAM: CHEST - 2 VIEW COMPARISON:  Chest radiograph July 03, 2016 FINDINGS: Cardiac silhouette is mildly enlarged. Status post median sternotomy for cardiac valve replacement. Calcified aortic knob. Diffuse interstitial prominence somewhat confluent in the lung bases with RIGHT lower lobe consolidation. Small RIGHT pleural effusion. Stable apical granulomas. No pneumothorax. Soft tissue planes and included osseous structures are nonsuspicious. Mild chronic wedging of lower thoracic vertebral bodies. IMPRESSION: Interstitial prominence most compatible with pulmonary edema confluent in the lung bases. Focal consolidation RIGHT lower lobe. Small RIGHT pleural effusion. Recommend follow-up chest radiograph after treatment to verify improvement. Mild cardiomegaly. Aortic Atherosclerosis (ICD10-I70.0). Electronically Signed   By: Elon Alas M.D.   On: 11/11/2017 21:28    (Echo, Carotid, EGD, Colonoscopy, ERCP)    Subjective:   Discharge Exam: Vitals:   11/18/17 0451  11/18/17 0947  BP: (!) 171/114 126/87  Pulse: (!) 104 (!) 102  Resp:  20  Temp: 98.9 F (37.2 C) 98 F (36.7 C)  SpO2: 99% 100%   Vitals:   11/17/17 2029 11/17/17 2319 11/18/17 0451 11/18/17 0947  BP: (!) 146/92 130/86 (!) 171/114 126/87  Pulse: 98 (!) 105 (!) 104 (!) 102  Resp:    20  Temp: 98.6 F (37 C)  98.9 F (37.2 C) 98 F (36.7 C)  TempSrc: Oral  Oral Oral  SpO2: 95%  99% 100%  Weight: 73 kg (160 lb 15 oz)     Height:        General: Pt is alert, awake, not in acute distress Cardiovascular: RRR, S1/S2 +, no rubs, no gallops Respiratory: CTA bilaterally, no wheezing, no rhonchi Abdominal: Soft, NT, ND, bowel sounds + Extremities: no edema, no cyanosis    The results of significant diagnostics from this hospitalization (including imaging, microbiology, ancillary and laboratory) are listed below for reference.     Microbiology: Recent Results (from the past 240 hour(s))  MRSA PCR Screening  Status: Abnormal   Collection Time: 11/13/17  2:39 AM  Result Value Ref Range Status   MRSA by PCR POSITIVE (A) NEGATIVE Final    Comment:        The GeneXpert MRSA Assay (FDA approved for NASAL specimens only), is one component of a comprehensive MRSA colonization surveillance program. It is not intended to diagnose MRSA infection nor to guide or monitor treatment for MRSA infections. RESULT CALLED TO, READ BACK BY AND VERIFIED WITH: T [facility] RN 11/13/17 4742 JDW Performed at [facility], [place] [address], [place], [place] [address]      Labs: BNP (last 3 results) No results for input(s): BNP in the last 8760 hours. Basic Metabolic Panel: Recent Labs  Lab 11/12/17 2130 11/13/17 0348 11/14/17 2025 11/15/17 0414 11/17/17 0720  NA 135 137 133* 134* 132*  K 5.2* 3.7 5.1 4.1 5.1  CL 97* 98* 95* 95* 95*  CO2 20* 26 23 26  20*  GLUCOSE 108* 161* 114* 125* 93  BUN 105* 37* 79* 30* 79*  CREATININE 16.15* 8.28* 13.03* 7.69* 14.58*  CALCIUM 7.9* 8.1* 8.2*  8.8* 8.8*  PHOS 7.3*  --  5.9*  --  6.5*   Liver Function Tests: Recent Labs  Lab 11/12/17 2130 11/14/17 2025 11/17/17 0720  ALBUMIN 2.9* 2.9* 3.2*   No results for input(s): LIPASE, AMYLASE in the last 168 hours. No results for input(s): AMMONIA in the last 168 hours. CBC: Recent Labs  Lab 11/11/17 2049 11/12/17 2207 11/14/17 2025 11/15/17 0414 11/17/17 0720  WBC 9.7 6.8 6.6 6.2 7.0  HGB 11.1* 8.8* 8.7* 10.4* 9.2*  HCT 33.5* 26.9* 25.7* 30.5* 27.0*  MCV 90.3 89.4 87.4 88.7 86.8  PLT 148* 114* 121* 135* 133*   Cardiac Enzymes: No results for input(s): CKTOTAL, CKMB, CKMBINDEX, TROPONINI in the last 168 hours. BNP: Invalid input(s): POCBNP CBG: Recent Labs  Lab 11/12/17 0126  GLUCAP 82   D-Dimer No results for input(s): DDIMER in the last 72 hours. Hgb A1c No results for input(s): HGBA1C in the last 72 hours. Lipid Profile No results for input(s): CHOL, HDL, LDLCALC, TRIG, CHOLHDL, LDLDIRECT in the last 72 hours. Thyroid function studies No results for input(s): TSH, T4TOTAL, T3FREE, THYROIDAB in the last 72 hours.  Invalid input(s): FREET3 Anemia work up No results for input(s): VITAMINB12, FOLATE, FERRITIN, TIBC, IRON, RETICCTPCT in the last 72 hours. Urinalysis No results found for: COLORURINE, APPEARANCEUR, Arp, [place], [place], [place], [place], [place], PROTEINUR, UROBILINOGEN, NITRITE, LEUKOCYTESUR Sepsis Labs Invalid input(s): PROCALCITONIN,  WBC,  LACTICIDVEN Microbiology Recent Results (from the past 240 hour(s))  MRSA PCR Screening     Status: Abnormal   Collection Time: 11/13/17  2:39 AM  Result Value Ref Range Status   MRSA by PCR POSITIVE (A) NEGATIVE Final    Comment:        The GeneXpert MRSA Assay (FDA approved for NASAL specimens only), is one component of a comprehensive MRSA colonization surveillance program. It is not intended to diagnose MRSA infection nor to guide or monitor treatment for MRSA infections. RESULT  CALLED TO, READ BACK BY AND VERIFIED WITH: T [facility] RN 11/13/17 8756 JDW Performed at [facility], [place] [address], [place], [place] [address]      Time coordinating discharge: Over 33 minutes  SIGNED:   Georgette Shell, MD  [facility] 11/18/2017, 11:04 AM Pager   If 7PM-7AM, please contact night-coverage [email or web address] Password [email or web address]

## 2018-01-29 ENCOUNTER — Inpatient Hospital Stay (HOSPITAL_COMMUNITY): Payer: Medicare Other

## 2018-01-29 ENCOUNTER — Emergency Department (HOSPITAL_COMMUNITY): Payer: Medicare Other

## 2018-01-29 ENCOUNTER — Other Ambulatory Visit: Payer: Self-pay

## 2018-01-29 ENCOUNTER — Encounter (HOSPITAL_COMMUNITY): Payer: Self-pay | Admitting: Radiology

## 2018-01-29 ENCOUNTER — Inpatient Hospital Stay (HOSPITAL_COMMUNITY)
Admission: EM | Admit: 2018-01-29 | Discharge: 2018-02-25 | DRG: 023 | Disposition: E | Payer: Medicare Other | Attending: Pulmonary Disease | Admitting: Pulmonary Disease

## 2018-01-29 DIAGNOSIS — G911 Obstructive hydrocephalus: Secondary | ICD-10-CM | POA: Diagnosis present

## 2018-01-29 DIAGNOSIS — I610 Nontraumatic intracerebral hemorrhage in hemisphere, subcortical: Secondary | ICD-10-CM | POA: Diagnosis present

## 2018-01-29 DIAGNOSIS — Z992 Dependence on renal dialysis: Secondary | ICD-10-CM | POA: Diagnosis not present

## 2018-01-29 DIAGNOSIS — E875 Hyperkalemia: Secondary | ICD-10-CM | POA: Diagnosis present

## 2018-01-29 DIAGNOSIS — Z888 Allergy status to other drugs, medicaments and biological substances status: Secondary | ICD-10-CM

## 2018-01-29 DIAGNOSIS — I080 Rheumatic disorders of both mitral and aortic valves: Secondary | ICD-10-CM | POA: Diagnosis present

## 2018-01-29 DIAGNOSIS — Z0389 Encounter for observation for other suspected diseases and conditions ruled out: Secondary | ICD-10-CM

## 2018-01-29 DIAGNOSIS — Z951 Presence of aortocoronary bypass graft: Secondary | ICD-10-CM

## 2018-01-29 DIAGNOSIS — I4892 Unspecified atrial flutter: Secondary | ICD-10-CM | POA: Diagnosis not present

## 2018-01-29 DIAGNOSIS — Z952 Presence of prosthetic heart valve: Secondary | ICD-10-CM

## 2018-01-29 DIAGNOSIS — E46 Unspecified protein-calorie malnutrition: Secondary | ICD-10-CM | POA: Diagnosis present

## 2018-01-29 DIAGNOSIS — R569 Unspecified convulsions: Secondary | ICD-10-CM

## 2018-01-29 DIAGNOSIS — I120 Hypertensive chronic kidney disease with stage 5 chronic kidney disease or end stage renal disease: Secondary | ICD-10-CM | POA: Diagnosis present

## 2018-01-29 DIAGNOSIS — I214 Non-ST elevation (NSTEMI) myocardial infarction: Secondary | ICD-10-CM | POA: Diagnosis not present

## 2018-01-29 DIAGNOSIS — E872 Acidosis: Secondary | ICD-10-CM | POA: Diagnosis not present

## 2018-01-29 DIAGNOSIS — Z7982 Long term (current) use of aspirin: Secondary | ICD-10-CM

## 2018-01-29 DIAGNOSIS — B1920 Unspecified viral hepatitis C without hepatic coma: Secondary | ICD-10-CM | POA: Diagnosis present

## 2018-01-29 DIAGNOSIS — N2581 Secondary hyperparathyroidism of renal origin: Secondary | ICD-10-CM | POA: Diagnosis present

## 2018-01-29 DIAGNOSIS — J9600 Acute respiratory failure, unspecified whether with hypoxia or hypercapnia: Secondary | ICD-10-CM | POA: Diagnosis present

## 2018-01-29 DIAGNOSIS — Z7902 Long term (current) use of antithrombotics/antiplatelets: Secondary | ICD-10-CM | POA: Diagnosis not present

## 2018-01-29 DIAGNOSIS — Z9889 Other specified postprocedural states: Secondary | ICD-10-CM

## 2018-01-29 DIAGNOSIS — H5702 Anisocoria: Secondary | ICD-10-CM | POA: Diagnosis present

## 2018-01-29 DIAGNOSIS — I2510 Atherosclerotic heart disease of native coronary artery without angina pectoris: Secondary | ICD-10-CM | POA: Diagnosis present

## 2018-01-29 DIAGNOSIS — N179 Acute kidney failure, unspecified: Secondary | ICD-10-CM

## 2018-01-29 DIAGNOSIS — R6521 Severe sepsis with septic shock: Secondary | ICD-10-CM | POA: Diagnosis not present

## 2018-01-29 DIAGNOSIS — A4102 Sepsis due to Methicillin resistant Staphylococcus aureus: Secondary | ICD-10-CM | POA: Diagnosis present

## 2018-01-29 DIAGNOSIS — R509 Fever, unspecified: Secondary | ICD-10-CM

## 2018-01-29 DIAGNOSIS — Z66 Do not resuscitate: Secondary | ICD-10-CM | POA: Diagnosis not present

## 2018-01-29 DIAGNOSIS — I609 Nontraumatic subarachnoid hemorrhage, unspecified: Secondary | ICD-10-CM

## 2018-01-29 DIAGNOSIS — N186 End stage renal disease: Secondary | ICD-10-CM | POA: Diagnosis present

## 2018-01-29 DIAGNOSIS — R7881 Bacteremia: Secondary | ICD-10-CM | POA: Diagnosis present

## 2018-01-29 DIAGNOSIS — Z8249 Family history of ischemic heart disease and other diseases of the circulatory system: Secondary | ICD-10-CM

## 2018-01-29 DIAGNOSIS — Z6824 Body mass index (BMI) 24.0-24.9, adult: Secondary | ICD-10-CM

## 2018-01-29 DIAGNOSIS — E441 Mild protein-calorie malnutrition: Secondary | ICD-10-CM | POA: Diagnosis present

## 2018-01-29 DIAGNOSIS — A419 Sepsis, unspecified organism: Secondary | ICD-10-CM | POA: Diagnosis not present

## 2018-01-29 DIAGNOSIS — Z515 Encounter for palliative care: Secondary | ICD-10-CM | POA: Diagnosis not present

## 2018-01-29 DIAGNOSIS — D631 Anemia in chronic kidney disease: Secondary | ICD-10-CM | POA: Diagnosis present

## 2018-01-29 DIAGNOSIS — I150 Renovascular hypertension: Secondary | ICD-10-CM | POA: Diagnosis present

## 2018-01-29 DIAGNOSIS — G9349 Other encephalopathy: Secondary | ICD-10-CM | POA: Diagnosis present

## 2018-01-29 DIAGNOSIS — D649 Anemia, unspecified: Secondary | ICD-10-CM | POA: Diagnosis present

## 2018-01-29 DIAGNOSIS — Z452 Encounter for adjustment and management of vascular access device: Secondary | ICD-10-CM

## 2018-01-29 DIAGNOSIS — I615 Nontraumatic intracerebral hemorrhage, intraventricular: Secondary | ICD-10-CM | POA: Diagnosis present

## 2018-01-29 DIAGNOSIS — F17210 Nicotine dependence, cigarettes, uncomplicated: Secondary | ICD-10-CM | POA: Diagnosis present

## 2018-01-29 DIAGNOSIS — B9562 Methicillin resistant Staphylococcus aureus infection as the cause of diseases classified elsewhere: Secondary | ICD-10-CM | POA: Diagnosis present

## 2018-01-29 DIAGNOSIS — G9340 Encephalopathy, unspecified: Secondary | ICD-10-CM | POA: Diagnosis present

## 2018-01-29 LAB — I-STAT CHEM 8, ED
BUN: 51 mg/dL — AB (ref 8–23)
CHLORIDE: 93 mmol/L — AB (ref 98–111)
CREATININE: 10.4 mg/dL — AB (ref 0.61–1.24)
Calcium, Ion: 0.94 mmol/L — ABNORMAL LOW (ref 1.15–1.40)
GLUCOSE: 113 mg/dL — AB (ref 70–99)
HCT: 45 % (ref 39.0–52.0)
Hemoglobin: 15.3 g/dL (ref 13.0–17.0)
Potassium: 4.7 mmol/L (ref 3.5–5.1)
Sodium: 134 mmol/L — ABNORMAL LOW (ref 135–145)
TCO2: 31 mmol/L (ref 22–32)

## 2018-01-29 LAB — COMPREHENSIVE METABOLIC PANEL
ALT: 17 U/L (ref 0–44)
AST: 42 U/L — AB (ref 15–41)
Albumin: 3.3 g/dL — ABNORMAL LOW (ref 3.5–5.0)
Alkaline Phosphatase: 62 U/L (ref 38–126)
Anion gap: 19 — ABNORMAL HIGH (ref 5–15)
BILIRUBIN TOTAL: 2 mg/dL — AB (ref 0.3–1.2)
BUN: 35 mg/dL — ABNORMAL HIGH (ref 8–23)
CALCIUM: 8.8 mg/dL — AB (ref 8.9–10.3)
CO2: 25 mmol/L (ref 22–32)
CREATININE: 10.9 mg/dL — AB (ref 0.61–1.24)
Chloride: 92 mmol/L — ABNORMAL LOW (ref 98–111)
GFR calc Af Amer: 5 mL/min — ABNORMAL LOW (ref >60)
GFR, EST NON AFRICAN AMERICAN: 4 mL/min — AB (ref >60)
Glucose, Bld: 114 mg/dL — ABNORMAL HIGH (ref 70–99)
Potassium: 5.5 mmol/L — ABNORMAL HIGH (ref 3.5–5.1)
Sodium: 136 mmol/L (ref 135–145)
TOTAL PROTEIN: 8.8 g/dL — AB (ref 6.5–8.1)

## 2018-01-29 LAB — CBC WITH DIFFERENTIAL/PLATELET
BAND NEUTROPHILS: 0 %
BASOS PCT: 0 %
BASOS PCT: 0 %
Basophils Absolute: 0 10*3/uL (ref 0.0–0.1)
Basophils Absolute: 0 10*3/uL (ref 0.0–0.1)
Blasts: 0 %
EOS ABS: 0.1 10*3/uL (ref 0.0–0.7)
EOS ABS: 0 10*3/uL (ref 0.0–0.7)
EOS PCT: 0 %
Eosinophils Relative: 1 %
HCT: 45 % (ref 39.0–52.0)
HCT: 21.3 % — ABNORMAL LOW (ref 39.0–52.0)
HEMOGLOBIN: 6.1 g/dL — AB (ref 13.0–17.0)
Hemoglobin: 13.9 g/dL (ref 13.0–17.0)
Lymphocytes Relative: 3 %
Lymphocytes Relative: 6 %
Lymphs Abs: 0.4 10*3/uL — ABNORMAL LOW (ref 0.7–4.0)
Lymphs Abs: 0.4 10*3/uL — ABNORMAL LOW (ref 0.7–4.0)
MCH: 29.4 pg (ref 26.0–34.0)
MCH: 29.2 pg (ref 26.0–34.0)
MCHC: 30.9 g/dL (ref 30.0–36.0)
MCHC: 28.6 g/dL — AB (ref 30.0–36.0)
MCV: 95.3 fL (ref 78.0–100.0)
MCV: 101.9 fL — AB (ref 78.0–100.0)
MONO ABS: 0.5 10*3/uL (ref 0.1–1.0)
MONO ABS: 0.2 10*3/uL (ref 0.1–1.0)
MONOS PCT: 4 %
Metamyelocytes Relative: 0 %
Monocytes Relative: 4 %
Myelocytes: 0 %
NEUTROS ABS: 5.5 10*3/uL (ref 1.7–7.7)
NRBC: 0 /100{WBCs}
Neutro Abs: 11.9 10*3/uL — ABNORMAL HIGH (ref 1.7–7.7)
Neutrophils Relative %: 92 %
Neutrophils Relative %: 90 %
OTHER: 0 %
PLATELETS: 130 10*3/uL — AB (ref 150–400)
PLATELETS: 61 10*3/uL — AB (ref 150–400)
Promyelocytes Relative: 0 %
RBC: 4.72 MIL/uL (ref 4.22–5.81)
RBC: 2.09 MIL/uL — ABNORMAL LOW (ref 4.22–5.81)
RDW: 15.7 % — AB (ref 11.5–15.5)
RDW: 15.6 % — ABNORMAL HIGH (ref 11.5–15.5)
WBC: 12.9 10*3/uL — ABNORMAL HIGH (ref 4.0–10.5)
WBC: 6.1 10*3/uL (ref 4.0–10.5)

## 2018-01-29 LAB — TYPE AND SCREEN
ABO/RH(D): O POS
Antibody Screen: NEGATIVE

## 2018-01-29 LAB — CBC
HCT: 42.6 % (ref 39.0–52.0)
Hemoglobin: 13.1 g/dL (ref 13.0–17.0)
MCH: 29 pg (ref 26.0–34.0)
MCHC: 30.8 g/dL (ref 30.0–36.0)
MCV: 94.2 fL (ref 78.0–100.0)
PLATELETS: 118 10*3/uL — AB (ref 150–400)
RBC: 4.52 MIL/uL (ref 4.22–5.81)
RDW: 15.6 % — AB (ref 11.5–15.5)
WBC: 12.4 10*3/uL — AB (ref 4.0–10.5)

## 2018-01-29 LAB — I-STAT CG4 LACTIC ACID, ED
LACTIC ACID, VENOUS: 3.51 mmol/L — AB (ref 0.5–1.9)
Lactic Acid, Venous: 0.86 mmol/L (ref 0.5–1.9)

## 2018-01-29 LAB — TRIGLYCERIDES: TRIGLYCERIDES: 165 mg/dL — AB (ref <150)

## 2018-01-29 MED ORDER — PANTOPRAZOLE SODIUM 40 MG PO PACK
40.0000 mg | PACK | Freq: Every day | ORAL | Status: DC
  Administered 2018-01-30 – 2018-02-03 (×5): 40 mg
  Filled 2018-01-29 (×5): qty 20

## 2018-01-29 MED ORDER — ROCURONIUM BROMIDE 50 MG/5ML IV SOLN
INTRAVENOUS | Status: AC | PRN
  Administered 2018-01-29: 60 mg via INTRAVENOUS

## 2018-01-29 MED ORDER — PROPOFOL 10 MG/ML IV BOLUS
INTRAVENOUS | Status: AC
  Filled 2018-01-29: qty 20

## 2018-01-29 MED ORDER — CHLORHEXIDINE GLUCONATE 0.12% ORAL RINSE (MEDLINE KIT)
15.0000 mL | Freq: Two times a day (BID) | OROMUCOSAL | Status: DC
  Administered 2018-01-29 – 2018-02-05 (×14): 15 mL via OROMUCOSAL

## 2018-01-29 MED ORDER — LORAZEPAM 2 MG/ML IJ SOLN
INTRAMUSCULAR | Status: AC
  Administered 2018-01-29: 18:00:00
  Filled 2018-01-29: qty 1

## 2018-01-29 MED ORDER — IOPAMIDOL (ISOVUE-370) INJECTION 76%
INTRAVENOUS | Status: AC
  Filled 2018-01-29: qty 50

## 2018-01-29 MED ORDER — PROPOFOL 1000 MG/100ML IV EMUL
5.0000 ug/kg/min | INTRAVENOUS | Status: DC
  Administered 2018-01-29: 5 ug/kg/min via INTRAVENOUS
  Filled 2018-01-29 (×3): qty 100

## 2018-01-29 MED ORDER — FENTANYL CITRATE (PF) 100 MCG/2ML IJ SOLN
INTRAMUSCULAR | Status: AC
  Filled 2018-01-29: qty 2

## 2018-01-29 MED ORDER — IOPAMIDOL (ISOVUE-370) INJECTION 76%
50.0000 mL | Freq: Once | INTRAVENOUS | Status: AC | PRN
  Administered 2018-01-29: 50 mL via INTRAVENOUS

## 2018-01-29 MED ORDER — SODIUM CHLORIDE 0.9 % IV SOLN
20.0000 ug | Freq: Once | INTRAVENOUS | Status: AC
  Administered 2018-01-29: 20 ug via INTRAVENOUS
  Filled 2018-01-29: qty 5

## 2018-01-29 MED ORDER — ACETAMINOPHEN 160 MG/5ML PO SOLN
325.0000 mg | ORAL | Status: DC | PRN
  Administered 2018-01-29 – 2018-02-02 (×6): 325 mg
  Filled 2018-01-29 (×6): qty 20.3

## 2018-01-29 MED ORDER — FENTANYL CITRATE (PF) 100 MCG/2ML IJ SOLN
50.0000 ug | Freq: Once | INTRAMUSCULAR | Status: AC
  Administered 2018-01-29: 50 ug via INTRAVENOUS

## 2018-01-29 MED ORDER — VANCOMYCIN HCL IN DEXTROSE 1-5 GM/200ML-% IV SOLN: 1000.0000 mg | Freq: Once | INTRAVENOUS | Status: DC

## 2018-01-29 MED ORDER — PIPERACILLIN-TAZOBACTAM 3.375 G IVPB: 3.3750 g | Freq: Two times a day (BID) | INTRAVENOUS | Status: DC

## 2018-01-29 MED ORDER — SODIUM CHLORIDE 0.9% IV SOLUTION
Freq: Once | INTRAVENOUS | Status: AC
  Administered 2018-01-29: 20:00:00 via INTRAVENOUS

## 2018-01-29 MED ORDER — PIPERACILLIN-TAZOBACTAM 3.375 G IVPB 30 MIN
3.3750 g | Freq: Once | INTRAVENOUS | Status: AC
  Administered 2018-01-29: 3.375 g via INTRAVENOUS
  Filled 2018-01-29: qty 50

## 2018-01-29 MED ORDER — VANCOMYCIN HCL 10 G IV SOLR
1500.0000 mg | Freq: Once | INTRAVENOUS | Status: AC
  Administered 2018-01-29: 1500 mg via INTRAVENOUS
  Filled 2018-01-29: qty 1500

## 2018-01-29 MED ORDER — SODIUM CHLORIDE 0.9 % IV BOLUS
500.0000 mL | Freq: Once | INTRAVENOUS | Status: AC
  Administered 2018-01-29: 500 mL via INTRAVENOUS

## 2018-01-29 MED ORDER — ACETAMINOPHEN 650 MG RE SUPP
650.0000 mg | Freq: Once | RECTAL | Status: AC
  Administered 2018-01-29: 650 mg via RECTAL
  Filled 2018-01-29: qty 1

## 2018-01-29 MED ORDER — LEVETIRACETAM IN NACL 1000 MG/100ML IV SOLN
1000.0000 mg | Freq: Once | INTRAVENOUS | Status: AC
Start: 2018-01-29 — End: 2018-01-29
  Administered 2018-01-29: 1000 mg via INTRAVENOUS

## 2018-01-29 MED ORDER — ORAL CARE MOUTH RINSE
15.0000 mL | OROMUCOSAL | Status: DC
  Administered 2018-01-29 – 2018-02-05 (×66): 15 mL via OROMUCOSAL

## 2018-01-29 MED ORDER — LEVETIRACETAM IN NACL 500 MG/100ML IV SOLN
500.0000 mg | Freq: Two times a day (BID) | INTRAVENOUS | Status: DC
  Administered 2018-01-30 – 2018-02-05 (×13): 500 mg via INTRAVENOUS
  Filled 2018-01-29 (×14): qty 100

## 2018-01-29 MED ORDER — ETOMIDATE 2 MG/ML IV SOLN
INTRAVENOUS | Status: AC | PRN
  Administered 2018-01-29: 20 mg via INTRAVENOUS

## 2018-01-29 MED ORDER — CLEVIDIPINE BUTYRATE 0.5 MG/ML IV EMUL
1.0000 mg/h | INTRAVENOUS | Status: DC
  Administered 2018-01-29: 1 mg/h via INTRAVENOUS
  Filled 2018-01-29: qty 50

## 2018-01-29 MED ORDER — NIMODIPINE 60 MG/20ML PO SOLN
60.0000 mg | ORAL | Status: DC
  Administered 2018-01-29 – 2018-02-05 (×41): 60 mg
  Filled 2018-01-29 (×44): qty 20

## 2018-01-29 MED ORDER — SODIUM CHLORIDE 0.9 % IV SOLN
2.0000 g | Freq: Two times a day (BID) | INTRAVENOUS | Status: DC
  Administered 2018-01-30 – 2018-02-01 (×6): 2 g via INTRAVENOUS
  Filled 2018-01-29 (×7): qty 20

## 2018-01-29 NOTE — ED Triage Notes (Signed)
On return from CT pt responded  To sternal rub only . Pt increased oral secretion oral suction done with thick fluid.

## 2018-01-29 NOTE — ED Provider Notes (Addendum)
Patient sent to me by Dr. Tamera Punt pending CT scan of head and neck after be involved in minor trauma.  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  Was informed by nursing staff that patient's mental status had become worse.  On arrival to the room patient was found to be experiencing a generalized tonic-clonic seizure.  He was given Ativan 1 mg IV push.  Patient also had a Keppra ordered 1 g.  Patient taken to the trauma room and was intubated for airway protection.  Tube placement confirmed by end-tidal CO2 detector as well as direct visualization of tube going through the cords.  Chest x-ray shows good tube placement.  Patient's pupils were 3 mm and reactive prior to the intubation.  No signs of herniation at this point.  Please see the resident note for that.  Discussed with Dr. Kathyrn Sheriff from neurosurgery who recommends CT angiogram.  Spoke with critical care physician on call who will come and see the patient.   CRITICAL CARE Performed by: Leota Jacobsen Total critical care time: 45 minutes Critical care time was exclusive of separately billable procedures and treating other patients. Critical care was necessary to treat or prevent imminent or life-threatening deterioration. Critical care was time spent personally by me on the following activities: development of treatment plan with patient and/or surrogate as well as nursing, discussions with consultants, evaluation of patient's response to treatment, examination of patient, obtaining history from patient or surrogate, ordering and performing treatments and interventions, ordering and review of laboratory studies, ordering and review of radiographic studies, pulse oximetry and re-evaluation of patient's condition.    Lacretia Leigh, MD 02/12/2018 Greer Ee    Lacretia Leigh, MD 02/08/2018 [phone]

## 2018-01-29 NOTE — Procedures (Signed)
PREOP DX: Hydrocephalus  POSTOP DX: Same  PROCEDURE: Right frontal ventriculostomy   SURGEON: Dr. Consuella Lose, MD  ANESTHESIA: IV Sedation (propofoland fentanyl) with Local  EBL: Minimal  SPECIMENS: None  COMPLICATIONS: None  CONDITION: Hemodynamically stable  INDICATIONS: Mrs. Nicholas Roach is a 67 y.o. male presenting to the ED with altered mental status. CT demonstrated diffuse SAH, IPH, IVH, and follow-up CTA demonstrated progressive hydrocephalus. EVD was therefore indicated.  PROCEDURE IN DETAIL: No family was immediately available and I therefore proceeded under emergent circumstances. Skin of the right frontal scalp was clipped, prepped and draped in the usual sterile fashion.  Scalp was then infiltrated with local anesthetic with epinephrine.  Skin incision was made sharply, and twist drill burr hole was made.  The dura was then incised, and the ventricular catheter was passed after several attempts into the right lateral ventricle.  Good CSF flow was obtained which appeared to be under low pressure, requiring dropping the catheter in order to obtain CSF flow.  The catheter was then tunneled subcutaneously and connected to a drainage system and the skin incision closed.  The drain was then secured in place.  FINDINGS: 1. Opening pressure ~21mmHg 2. Blood tinged CSF
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
## 2018-01-29 NOTE — ED Provider Notes (Signed)
INTUBATION Performed by: Prescilla Sours  Required items: required blood products, implants, devices, and special equipment available Patient identity confirmed: provided demographic data and hospital-assigned identification number Time out: Immediately prior to procedure a "time out" was called to verify the correct patient, procedure, equipment, support staff and site/side marked as required.  Indications: Respiratory failure, airway protectoin  Intubation method: Glidescope Laryngoscopy   Preoxygenation: NRB  Sedatives: Etomidate  Paralytic: Rocuronium  Tube Size: 7.5 cuffed  Post-procedure assessment: chest rise and ETCO2 monitor Breath sounds: equal and absent over the epigastrium Tube secured with: ETT holder Chest x-ray interpreted by radiologist and me.  Chest x-ray findings: endotracheal tube in appropriate position  Patient tolerated the procedure well with no immediate complications.       Prescilla Sours, MD 02/19/2018 Mallie Mussel    Lacretia Leigh, MD 02/06/2018 [phone]

## 2018-01-29 NOTE — ED Triage Notes (Signed)
Pt is able to roll to RT side  With out assistfor this to give rectal Tylenol supp.

## 2018-01-29 NOTE — ED Triage Notes (Signed)
Pt has poor access multiple IV sticks with no access. Pt arrived with IV intact in his hand but need to draw blood above site before ANT-BX started.

## 2018-01-29 NOTE — ED Triage Notes (Signed)
Collected one blue culture bottle because of PT poor access . Will consult IV team for 2nd IV.

## 2018-01-29 NOTE — ED Notes (Signed)
Bladder scan completed, 98 ml resulted

## 2018-01-29 NOTE — ED Triage Notes (Signed)
PT transported to [facility] by RN and Resp. Marland Kitchen

## 2018-01-29 NOTE — Progress Notes (Signed)
Pharmacy Antibiotic Note  Nicholas Roach is a 67 y.o. male admitted on 01/30/2018 with sepsis.  Pharmacy has been consulted for Zosyn and vancomycin dosing.  ESRD. SCr up to 10.4 this admit. Missed HD over last week.   Plan: Start Zosyn 3.375 gm IV q12h (4 hour infusion) Give vancomycin 1.5g IV x 1, then follow up HD schedule Monitor clinical picture, renal function, preHD VR prn F/U C&S, abx deescalation / LOT    Temp (24hrs), Avg:104.1 F (40.1 C), Min:103.1 F (39.5 C), Max:105 F (40.6 C)  No results for input(s): WBC, CREATININE, LATICACIDVEN, VANCOTROUGH, VANCOPEAK, VANCORANDOM, GENTTROUGH, GENTPEAK, GENTRANDOM, TOBRATROUGH, TOBRAPEAK, TOBRARND, AMIKACINPEAK, AMIKACINTROU, AMIKACIN in the last 168 hours.  CrCl cannot be calculated (Patient's most recent lab result is older than the maximum 21 days allowed.).    Allergies  Allergen Reactions  . Lisinopril Other (See Comments)    cough    Thank you for allowing pharmacy to be a part of this patient's care.  Reginia Naas 02/12/2018 12:21 PM

## 2018-01-29 NOTE — ED Triage Notes (Signed)
Pt alert to name and reported he was feeling a little better. Pt stretching and yawing.Marland Kitchen
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
## 2018-01-29 NOTE — ED Triage Notes (Signed)
Pt is A Dialysis Pt  And at this time  IV fluid bolus is not ordered.

## 2018-01-29 NOTE — Progress Notes (Signed)
Patient transported on ventilator to CT and back to 4N16. Vitals remained stable.

## 2018-01-29 NOTE — ED Notes (Signed)
Dr. Cato Mulligan (CN) pg to [phone]-per Dr. Guinevere Scarlet by Levada Dy
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
## 2018-01-30 ENCOUNTER — Inpatient Hospital Stay (HOSPITAL_COMMUNITY): Payer: Medicare Other

## 2018-01-30 DIAGNOSIS — I350 Nonrheumatic aortic (valve) stenosis: Secondary | ICD-10-CM

## 2018-01-30 LAB — BLOOD CULTURE ID PANEL (REFLEXED)
Acinetobacter baumannii: NOT DETECTED
CANDIDA ALBICANS: NOT DETECTED
CANDIDA KRUSEI: NOT DETECTED
Candida glabrata: NOT DETECTED
Candida parapsilosis: NOT DETECTED
Candida tropicalis: NOT DETECTED
ENTEROBACTERIACEAE SPECIES: NOT DETECTED
ENTEROCOCCUS SPECIES: NOT DETECTED
ESCHERICHIA COLI: NOT DETECTED
Enterobacter cloacae complex: NOT DETECTED
Haemophilus influenzae: NOT DETECTED
KLEBSIELLA PNEUMONIAE: NOT DETECTED
Klebsiella oxytoca: NOT DETECTED
LISTERIA MONOCYTOGENES: NOT DETECTED
Methicillin resistance: DETECTED — AB
NEISSERIA MENINGITIDIS: NOT DETECTED
PROTEUS SPECIES: NOT DETECTED
Pseudomonas aeruginosa: NOT DETECTED
STREPTOCOCCUS AGALACTIAE: NOT DETECTED
STREPTOCOCCUS PYOGENES: NOT DETECTED
STREPTOCOCCUS SPECIES: NOT DETECTED
Serratia marcescens: NOT DETECTED
Staphylococcus aureus (BCID): DETECTED — AB
Staphylococcus species: DETECTED — AB
Streptococcus pneumoniae: NOT DETECTED

## 2018-01-30 LAB — BASIC METABOLIC PANEL
ANION GAP: 17 — AB (ref 5–15)
BUN: 53 mg/dL — ABNORMAL HIGH (ref 8–23)
CO2: 24 mmol/L (ref 22–32)
Calcium: 8 mg/dL — ABNORMAL LOW (ref 8.9–10.3)
Chloride: 95 mmol/L — ABNORMAL LOW (ref 98–111)
Creatinine, Ser: 12.15 mg/dL — ABNORMAL HIGH (ref 0.61–1.24)
GFR calc non Af Amer: 4 mL/min — ABNORMAL LOW (ref >60)
GFR, EST AFRICAN AMERICAN: 4 mL/min — AB (ref >60)
GLUCOSE: 159 mg/dL — AB (ref 70–99)
Potassium: 4.3 mmol/L (ref 3.5–5.1)
Sodium: 136 mmol/L (ref 135–145)

## 2018-01-30 LAB — BLOOD GAS, ARTERIAL
ACID-BASE EXCESS: 1.3 mmol/L (ref 0.0–2.0)
BICARBONATE: 24.5 mmol/L (ref 20.0–28.0)
DRAWN BY: 313941
FIO2: 70
LHR: 22 {breaths}/min
MECHVT: 550 mL
O2 SAT: 98.5 %
PCO2 ART: 34 mmHg (ref 32.0–48.0)
PEEP/CPAP: 5 cmH2O
PH ART: 7.474 — AB (ref 7.350–7.450)
PO2 ART: 117 mmHg — AB (ref 83.0–108.0)
Patient temperature: 99.5

## 2018-01-30 LAB — MRSA PCR SCREENING: MRSA BY PCR: POSITIVE — AB

## 2018-01-30 LAB — BPAM PLATELET PHERESIS
Blood Product Expiration Date: 201907062359
ISSUE DATE / TIME: 201907052156
Unit Type and Rh: 6200

## 2018-01-30 LAB — CBC
HEMATOCRIT: 33.2 % — AB (ref 39.0–52.0)
Hemoglobin: 10.5 g/dL — ABNORMAL LOW (ref 13.0–17.0)
MCH: 29.2 pg (ref 26.0–34.0)
MCHC: 31.6 g/dL (ref 30.0–36.0)
MCV: 92.5 fL (ref 78.0–100.0)
PLATELETS: 101 10*3/uL — AB (ref 150–400)
RBC: 3.59 MIL/uL — ABNORMAL LOW (ref 4.22–5.81)
RDW: 15.7 % — AB (ref 11.5–15.5)
WBC: 11.4 10*3/uL — AB (ref 4.0–10.5)

## 2018-01-30 LAB — PREPARE PLATELET PHERESIS: Unit division: 0

## 2018-01-30 MED ORDER — CHLORHEXIDINE GLUCONATE CLOTH 2 % EX PADS
6.0000 | MEDICATED_PAD | Freq: Every day | CUTANEOUS | Status: DC
  Administered 2018-01-31 – 2018-02-01 (×2): 6 via TOPICAL

## 2018-01-30 MED ORDER — VANCOMYCIN HCL IN DEXTROSE 750-5 MG/150ML-% IV SOLN
750.0000 mg | INTRAVENOUS | Status: AC
  Administered 2018-01-30: 750 mg via INTRAVENOUS
  Filled 2018-01-30: qty 150

## 2018-01-30 MED ORDER — PHENYLEPHRINE HCL-NACL 10-0.9 MG/250ML-% IV SOLN
0.0000 ug/min | INTRAVENOUS | Status: DC
  Administered 2018-01-30: 300 ug/min via INTRAVENOUS
  Administered 2018-01-30: 20 ug/min via INTRAVENOUS
  Filled 2018-01-30 (×6): qty 250

## 2018-01-30 MED ORDER — SODIUM CHLORIDE 0.9 % IV BOLUS
500.0000 mL | Freq: Once | INTRAVENOUS | Status: AC
  Administered 2018-01-30: 500 mL via INTRAVENOUS

## 2018-01-30 NOTE — Consult Note (Signed)
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 changes of median sternotomy and CABG. Electronically Signed   By: Corrie Mckusick D.O.   On: 01/28/2018 12:38   Dg Abd Portable 1 View  Result Date: 02/11/2018 CLINICAL DATA:  OG tube placement. EXAM: PORTABLE ABDOMEN - 1 VIEW COMPARISON:  None. FINDINGS: An enteric tube terminates in the midline of the central to lower abdomen, likely in the distal stomach. No dilated loops of bowel are seen to suggest obstruction. A small right pleural effusion is noted, with the lungs more fully evaluated on recent chest radiograph. No acute osseous abnormality is identified. IMPRESSION: Enteric tube in the distal stomach. Electronically Signed   By: Logan Bores M.D.   On: 02/14/2018 19:01     Assessment/Plan:  67yo M with hx of HTN, ESRD admitted for fevers and SAH, IVH with MRSA bacteremia. 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  ESRD on HD = has had a dose of vancomycin on admit, to redose post HD.

## 2018-01-30 NOTE — Progress Notes (Signed)
Patient to receive ventriculostomy. Rn at bedside. Patient hemodynamically stable.
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
## 2018-01-30 NOTE — Progress Notes (Signed)
eLink Physician-Brief Progress Note Patient Name: Nicholas Roach DOB: [birth year] MRN: [id number]   Date of Service  01/30/2018  HPI/Events of Note  Hypotension - BP = 58/41  eICU Interventions  Will order: 1. Bolus with 0.9 NaCl 500 mL IV over 30 minutes now. 2. Phenylephrine IV infusion. Titrate to MAP > 65.     Intervention Category Major Interventions: Hypotension - evaluation and management  Sommer,Steven Eugene 01/30/2018, 10:24 PM
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
## 2018-01-30 NOTE — Progress Notes (Signed)
Patient's daughter notified of his admit to [facility]. RN to continue to monitor

## 2018-01-30 NOTE — Progress Notes (Signed)
Pharmacy Antibiotic Note  Nicholas Roach is a 67 y.o. male admitted on 02/13/2018 with sepsis, MRSA bacteremia HD patient for HD today  Plan: Vanc 750 mg x 1 today with HD Consider dc of ceftriaxone F/U HD schedule for further vanc  Height: 5\' 8"  (172.7 cm) Weight: 165 lb 9.1 oz (75.1 kg) IBW/kg (Calculated) : 68.4  Temp (24hrs), Avg:100.1 F (37.8 C), Min:98 F (36.7 C), Max:102.7 F (39.3 C)  Recent Labs  Lab 01/25/2018 1231 02/03/2018 1242 02/22/2018 1342 02/20/2018 1355 01/25/2018 1527 01/30/18 0619  WBC 12.9*  --  6.1  --  12.4* 11.4*  CREATININE 10.90* 10.40*  --   --   --  12.15*  LATICACIDVEN  --  3.51*  --  0.86  --   --     Estimated Creatinine Clearance: 5.9 mL/min (A) (by C-G formula based on SCr of 12.15 mg/dL (H)).    Allergies  Allergen Reactions  . Lisinopril Other (See Comments)    cough   Levester Fresh, PharmD, BCPS, BCCCP Clinical Pharmacist [phone]  Please check AMION for all [place] numbers  01/30/2018 12:32 PM

## 2018-01-30 NOTE — Progress Notes (Signed)
PHARMACY - PHYSICIAN COMMUNICATION CRITICAL VALUE ALERT - BLOOD CULTURE IDENTIFICATION (BCID)  Nicholas Roach is an 67 y.o. male who presented to [facility] on 02/03/2018 with a chief complaint of altered mental status/fever  Assessment:  MRSA bacteremia   Name of physician (or Provider) Contacted: [facility]  Current antibiotics: Vancomycin/Ceftriaxone  Changes to prescribed antibiotics recommended:  No changes needed   Results for orders placed or performed during the hospital encounter of 02/13/2018  Blood Culture ID Panel (Reflexed) (Collected: 01/28/2018 12:31 PM)  Result Value Ref Range   Enterococcus species NOT DETECTED NOT DETECTED   Listeria monocytogenes NOT DETECTED NOT DETECTED   Staphylococcus species DETECTED (A) NOT DETECTED   Staphylococcus aureus DETECTED (A) NOT DETECTED   Methicillin resistance DETECTED (A) NOT DETECTED   Streptococcus species NOT DETECTED NOT DETECTED   Streptococcus agalactiae NOT DETECTED NOT DETECTED   Streptococcus pneumoniae NOT DETECTED NOT DETECTED   Streptococcus pyogenes NOT DETECTED NOT DETECTED   Acinetobacter baumannii NOT DETECTED NOT DETECTED   Enterobacteriaceae species NOT DETECTED NOT DETECTED   Enterobacter cloacae complex NOT DETECTED NOT DETECTED   Escherichia coli NOT DETECTED NOT DETECTED   Klebsiella oxytoca NOT DETECTED NOT DETECTED   Klebsiella pneumoniae NOT DETECTED NOT DETECTED   Proteus species NOT DETECTED NOT DETECTED   Serratia marcescens NOT DETECTED NOT DETECTED   Haemophilus influenzae NOT DETECTED NOT DETECTED   Neisseria meningitidis NOT DETECTED NOT DETECTED   Pseudomonas aeruginosa NOT DETECTED NOT DETECTED   Candida albicans NOT DETECTED NOT DETECTED   Candida glabrata NOT DETECTED NOT DETECTED   Candida krusei NOT DETECTED NOT DETECTED   Candida parapsilosis NOT DETECTED NOT DETECTED   Candida tropicalis NOT DETECTED NOT DETECTED    [name] 01/30/2018  12:57 AM

## 2018-01-30 NOTE — Progress Notes (Signed)
Patient arrived to unit without difficulties. CTA still needed due to lost of access. New IV obtained and patient taken down to CTA. RN to continue to monitor.
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
## 2018-01-30 NOTE — Progress Notes (Signed)
Neurosurgery notified of patient's family at bedside and waiting to speak to MD.

## 2018-01-30 NOTE — Progress Notes (Signed)
  NEUROSURGERY PROGRESS NOTE   Pt seen and examined. No issues overnight.   EXAM: Temp:  [98 F (36.7 C)-105 F (40.6 C)] 99.5 F (37.5 C) (07/06 0800) Pulse Rate:  [78-133] 78 (07/06 0900) Resp:  [15-34] 20 (07/06 0900) BP: (65-173)/(42-110) 95/66 (07/06 0900) SpO2:  [91 %-100 %] 97 % (07/06 0900) FiO2 (%):  [50 %-100 %] 50 % (07/06 0710) Weight:  [56.7 kg (125 lb)-75.1 kg (165 lb 9.1 oz)] 75.1 kg (165 lb 9.1 oz) (07/06 0500) Intake/Output      07/05 0701 - 07/06 0700 07/06 0701 - 07/07 0700   I.V. (mL/kg) 82.2 (1.1) 4.1 (0.1)   Blood 223.2    IV Piggyback 1342.1    Total Intake(mL/kg) 1647.5 (21.9) 4.1 (0.1)   Emesis/NG output 400    Drains 7 1   Stool 100    Total Output 507 1   Net +1140.5 +3.1         No eye opening to pain Right pupil 74mm, reactive Left pupil 24mm, minimally reactive Spontaneous non-purposeful movements BUE/BLE, ?W/D to pain EVD in place, functional  LABS: Lab Results  Component Value Date   CREATININE 12.15 (H) 01/30/2018   BUN 53 (H) 01/30/2018   NA 136 01/30/2018   K 4.3 01/30/2018   CL 95 (L) 01/30/2018   CO2 24 01/30/2018   Lab Results  Component Value Date   WBC 11.4 (H) 01/30/2018   HGB 10.5 (L) 01/30/2018   HCT 33.2 (L) 01/30/2018   MCV 92.5 01/30/2018   PLT 101 (L) 01/30/2018    IMPRESSION: - 67 y.o. male with large volume SAH, IVH, IPH. CTA negative. From neurologic standpoint I wonder about the etiology of his hemorrhage. While there is large volume basal SAH, the presence of the periventricular parenchymal hemorrhage is very unusual for aneurysm as there is no common aneurysm which is contiguous with this location. Certainly in this situation mycotic aneurysm is a consideration.  - HCP - VDRF - Sepsis - ESRD  PLAN: - Will repeat non-contrast CTH this am - With his mental status and questionable SZ in the ED, I will increase Keppra to 1000mg  Q12 and will get EEG - I do think that formal catheter angiogram would be  reasonable, may do this after EEG either tomorrow or Monday. In the meantime would continue IV abx.

## 2018-01-30 NOTE — Progress Notes (Signed)
RT transported patient on ventilator from room 4N16 to CT and back to room 1L87 with no complications. Vitals are stable. RT will continue to monitor.

## 2018-01-30 NOTE — Progress Notes (Signed)
  Echocardiogram 2D Echocardiogram has been performed.  Nicholas Roach 01/30/2018, 5:57 PM

## 2018-01-30 NOTE — Progress Notes (Signed)
Patient hypotensive with BP 58/41. [place] notified. Dr. Levada Schilling placed new orders. RN will continue to monitor.

## 2018-01-31 ENCOUNTER — Inpatient Hospital Stay (HOSPITAL_COMMUNITY): Payer: Medicare Other

## 2018-01-31 DIAGNOSIS — R4182 Altered mental status, unspecified: Secondary | ICD-10-CM

## 2018-01-31 DIAGNOSIS — R40243 Glasgow coma scale score 3-8, unspecified time: Secondary | ICD-10-CM

## 2018-01-31 LAB — POCT I-STAT 3, ART BLOOD GAS (G3+)
Acid-base deficit: 2 mmol/L (ref 0.0–2.0)
Bicarbonate: 20 mmol/L (ref 20.0–28.0)
O2 Saturation: 98 %
PCO2 ART: 27.3 mmHg — AB (ref 32.0–48.0)
PH ART: 7.474 — AB (ref 7.350–7.450)
Patient temperature: 98.7
TCO2: 21 mmol/L — ABNORMAL LOW (ref 22–32)
pO2, Arterial: 99 mmHg (ref 83.0–108.0)

## 2018-01-31 LAB — CBC WITH DIFFERENTIAL/PLATELET
Abs Immature Granulocytes: 0.1 10*3/uL (ref 0.0–0.1)
BASOS PCT: 0 %
Basophils Absolute: 0 10*3/uL (ref 0.0–0.1)
EOS ABS: 0 10*3/uL (ref 0.0–0.7)
Eosinophils Relative: 0 %
HEMATOCRIT: 31.7 % — AB (ref 39.0–52.0)
Hemoglobin: 10 g/dL — ABNORMAL LOW (ref 13.0–17.0)
IMMATURE GRANULOCYTES: 1 %
LYMPHS ABS: 0.3 10*3/uL — AB (ref 0.7–4.0)
Lymphocytes Relative: 2 %
MCH: 29.2 pg (ref 26.0–34.0)
MCHC: 31.5 g/dL (ref 30.0–36.0)
MCV: 92.4 fL (ref 78.0–100.0)
Monocytes Absolute: 1.2 10*3/uL — ABNORMAL HIGH (ref 0.1–1.0)
Monocytes Relative: 8 %
NEUTROS ABS: 12.6 10*3/uL — AB (ref 1.7–7.7)
Neutrophils Relative %: 89 %
Platelets: 107 10*3/uL — ABNORMAL LOW (ref 150–400)
RBC: 3.43 MIL/uL — ABNORMAL LOW (ref 4.22–5.81)
RDW: 16 % — ABNORMAL HIGH (ref 11.5–15.5)
WBC: 14.3 10*3/uL — AB (ref 4.0–10.5)

## 2018-01-31 LAB — POCT I-STAT 7, (LYTES, BLD GAS, ICA,H+H)
Acid-base deficit: 3 mmol/L — ABNORMAL HIGH (ref 0.0–2.0)
Bicarbonate: 21.4 mmol/L (ref 20.0–28.0)
Calcium, Ion: 0.96 mmol/L — ABNORMAL LOW (ref 1.15–1.40)
HCT: 33 % — ABNORMAL LOW (ref 39.0–52.0)
HEMOGLOBIN: 11.2 g/dL — AB (ref 13.0–17.0)
O2 Saturation: 97 %
PCO2 ART: 34 mmHg (ref 32.0–48.0)
POTASSIUM: 4.1 mmol/L (ref 3.5–5.1)
SODIUM: 138 mmol/L (ref 135–145)
TCO2: 22 mmol/L (ref 22–32)
pH, Arterial: 7.408 (ref 7.350–7.450)
pO2, Arterial: 87 mmHg (ref 83.0–108.0)

## 2018-01-31 LAB — CORTISOL: Cortisol, Plasma: 38.9 ug/dL

## 2018-01-31 LAB — BASIC METABOLIC PANEL
Anion gap: 24 — ABNORMAL HIGH (ref 5–15)
BUN: 78 mg/dL — ABNORMAL HIGH (ref 8–23)
CO2: 15 mmol/L — AB (ref 22–32)
Calcium: 8.2 mg/dL — ABNORMAL LOW (ref 8.9–10.3)
Chloride: 97 mmol/L — ABNORMAL LOW (ref 98–111)
Creatinine, Ser: 14.9 mg/dL — ABNORMAL HIGH (ref 0.61–1.24)
GFR calc non Af Amer: 3 mL/min — ABNORMAL LOW (ref >60)
GFR, EST AFRICAN AMERICAN: 3 mL/min — AB (ref >60)
Glucose, Bld: 116 mg/dL — ABNORMAL HIGH (ref 70–99)
Potassium: 3.7 mmol/L (ref 3.5–5.1)
Sodium: 136 mmol/L (ref 135–145)

## 2018-01-31 LAB — TROPONIN I: TROPONIN I: 1.56 ng/mL — AB (ref <0.03)

## 2018-01-31 LAB — COMPREHENSIVE METABOLIC PANEL
ALK PHOS: 128 U/L — AB (ref 38–126)
ALT: 19 U/L (ref 0–44)
AST: 32 U/L (ref 15–41)
Albumin: 2.3 g/dL — ABNORMAL LOW (ref 3.5–5.0)
Anion gap: 18 — ABNORMAL HIGH (ref 5–15)
BUN: 67 mg/dL — ABNORMAL HIGH (ref 8–23)
CALCIUM: 7.7 mg/dL — AB (ref 8.9–10.3)
CO2: 21 mmol/L — AB (ref 22–32)
Chloride: 97 mmol/L — ABNORMAL LOW (ref 98–111)
Creatinine, Ser: 13.26 mg/dL — ABNORMAL HIGH (ref 0.61–1.24)
GFR calc non Af Amer: 3 mL/min — ABNORMAL LOW (ref >60)
GFR, EST AFRICAN AMERICAN: 4 mL/min — AB (ref >60)
Glucose, Bld: 154 mg/dL — ABNORMAL HIGH (ref 70–99)
POTASSIUM: 4.2 mmol/L (ref 3.5–5.1)
SODIUM: 136 mmol/L (ref 135–145)
Total Bilirubin: 0.7 mg/dL (ref 0.3–1.2)
Total Protein: 6.5 g/dL (ref 6.5–8.1)

## 2018-01-31 LAB — ECHOCARDIOGRAM COMPLETE
HEIGHTINCHES: 68 in
WEIGHTICAEL: 2649.05 [oz_av]

## 2018-01-31 LAB — PROCALCITONIN: PROCALCITONIN: 54.16 ng/mL

## 2018-01-31 LAB — MAGNESIUM: Magnesium: 2.1 mg/dL (ref 1.7–2.4)

## 2018-01-31 LAB — PHOSPHORUS: Phosphorus: 8.5 mg/dL — ABNORMAL HIGH (ref 2.5–4.6)

## 2018-01-31 LAB — LACTIC ACID, PLASMA: LACTIC ACID, VENOUS: 1.3 mmol/L (ref 0.5–1.9)

## 2018-01-31 MED ORDER — PHENYLEPHRINE HCL-NACL 40-0.9 MG/250ML-% IV SOLN
0.0000 ug/min | INTRAVENOUS | Status: DC
  Filled 2018-01-31 (×2): qty 250

## 2018-01-31 MED ORDER — NOREPINEPHRINE 16 MG/250ML-% IV SOLN
0.0000 ug/min | INTRAVENOUS | Status: DC
  Administered 2018-01-31: 15 ug/min via INTRAVENOUS
  Administered 2018-01-31: 20 ug/min via INTRAVENOUS
  Filled 2018-01-31 (×3): qty 250

## 2018-01-31 MED ORDER — SODIUM CHLORIDE 0.9 % IV SOLN
INTRAVENOUS | Status: DC
  Administered 2018-01-31 – 2018-02-05 (×2): via INTRAVENOUS

## 2018-01-31 MED ORDER — NOREPINEPHRINE 4 MG/250ML-% IV SOLN
0.0000 ug/min | INTRAVENOUS | Status: DC
  Administered 2018-01-31: 2 ug/min via INTRAVENOUS
  Filled 2018-01-31: qty 250

## 2018-01-31 MED ORDER — SODIUM CHLORIDE 0.9 % IV SOLN
INTRAVENOUS | Status: DC | PRN
  Administered 2018-01-31: 10 mL/h via INTRA_ARTERIAL

## 2018-01-31 NOTE — Progress Notes (Signed)
Patient ID: Nicholas Roach, male   DOB: 09-14-1950, 67 y.o.   MRN: [id number]  [name] Progress Note   Assessment/ Plan:    1.  Subarachnoid hemorrhage with basal ganglia/intraventricular hemorrhage: Evaluated with angiogram that did not show aneurysm-additional work-up and management per neurosurgery.  Status post right frontal ventriculostomy. 2.  End-stage renal disease: Usually on TTS HD schedule- unable to get dialysis yesterday due to staffing problems. Currently on pressors (levophed) and without acute electrolyte abnormalities or volume concerns to indicate for acute dialysis and renal replacement therapy can be postponed another 24hr- I would not recommend CRRT with declining neurological exam. 3.  Hypotension: on levophed drip and treating underlying infection. 4.  Anemia of chronic kidney disease: We will monitor hemoglobin/hematocrit trend to decide on indicators to restart ESA. 5.  Secondary hyperparathyroidism: Phosphorus binders on hold while intubated, awating medication list from his home dialysis unit to decide on need/dosing of VDRA/cinacalcet. 6.  Fever: Blood cultures growing MRSA- on vancomycin and ceftriaxone. Echo report pending.   Subjective:   Hypotensive overnight and currently on pressors. Neurologically worse with anisocoria and disconjugate gaze.    Objective:   BP (!) 123/53   Pulse (!) 59   Temp 98.1 F (36.7 C) (Axillary)   Resp 19   Ht 5' 8"  (1.727 m)   Wt 77.8 kg (171 lb 8.3 oz)   SpO2 100%   BMI 26.08 kg/m   Physical Exam: Gen: Intubated, unresponsive (not on sedation) ONG:EXBMW regular rhythm and normal rate, 3/6 HSM over apex Resp:Coarse breath sounds, no rales/rhonchi UXL:KGMW, flat, non-tender Ext:No LE edema  Labs: BMET Recent Labs  Lab 02/08/2018 1231 02/03/2018 1242 01/30/18 0619 01/31/18 0040 01/31/18 0120  NA 136 134* 136 138 136  K 5.5* 4.7 4.3 4.1 4.2  CL 92* 93* 95*  --  97*  CO2 25  --  24  --  21*  GLUCOSE  114* 113* 159*  --  154*  BUN 35* 51* 53*  --  67*  CREATININE 10.90* 10.40* 12.15*  --  13.26*  CALCIUM 8.8*  --  8.0*  --  7.7*  PHOS  --   --   --   --  8.5*   CBC Recent Labs  Lab 01/30/2018 1231  02/06/2018 1342 02/11/2018 1527 01/30/18 0619 01/31/18 0040 01/31/18 0120  WBC 12.9*  --  6.1 12.4* 11.4*  --  14.3*  NEUTROABS 11.9*  --  5.5  --   --   --  12.6*  HGB 13.9   < > 6.1* 13.1 10.5* 11.2* 10.0*  HCT 45.0   < > 21.3* 42.6 33.2* 33.0* 31.7*  MCV 95.3  --  101.9* 94.2 92.5  --  92.4  PLT 130*  --  61* 118* 101*  --  107*   < > = values in this interval not displayed.   Medications:    . chlorhexidine gluconate (MEDLINE KIT)  15 mL Mouth Rinse BID  . Chlorhexidine Gluconate Cloth  6 each Topical Q0600  . mouth rinse  15 mL Mouth Rinse 10 times per day  . niMODipine  60 mg Per Tube Q4H  . pantoprazole sodium  40 mg Per Tube Daily   Elmarie Shiley, MD 01/31/2018, 9:28 AM
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
## 2018-01-31 NOTE — Progress Notes (Signed)
Patient had EKG rhythm change into Aflutter. Elink, Dr. Oletta Darter,  notified. New orders given. RN will continue to monitor

## 2018-01-31 NOTE — Procedures (Signed)
Arterial Catheter Insertion Procedure Note Nicholas Roach [id number] 03-10-1951  Procedure: Insertion of Arterial Catheter  Indications: Blood pressure monitoring  Procedure Details Consent: Risks of procedure as well as the alternatives and risks of each were explained to the (patient/caregiver).  Consent for procedure obtained. Time Out: Verified patient identification, verified procedure, site/side was marked, verified correct patient position, special equipment/implants available, medications/allergies/relevent history reviewed, required imaging and test results available.  Performed  Maximum sterile technique was used including antiseptics, cap, gloves, gown, hand hygiene, mask and sheet. Skin prep: Chlorhexidine; local anesthetic administered 20 gauge catheter was inserted into right radial artery using the Seldinger technique. ULTRASOUND GUIDANCE USED: NO Evaluation Blood flow good; BP tracing good. Complications: No apparent complications.   Nicholas Roach 01/31/2018

## 2018-01-31 NOTE — Progress Notes (Signed)
  NEUROSURGERY PROGRESS NOTE   No issues overnight.   EXAM:  BP 113/75   Pulse (!) 56   Temp 98.1 F (36.7 C) (Axillary)   Resp 20   Ht 5\' 8"  (1.727 m)   Wt 77.8 kg (171 lb 8.3 oz)   SpO2 100%   BMI 26.08 kg/m   Eyes open, blinks spontaneously Both pupils reactive Breathing minimally over vent No significant motor responses to central pain Right frontal EVD in place, functioning well  IMAGING: CT 7/6 and 7/7 reviewed, miniscule tract hemorrhage from EVD passage. Catheter tip in right lateral ventricle body. Stable hemorrhage.  IMPRESSION:  66 y.o. male s/p SAH/IVH/IPH. Unclear etiology. Remains minimally conscious without improvement after EVD. ESRD VDRF ?Sepsis/bacteremia  PLAN: - Will d/w neurology for EEG today - Cont EVD drainage.  - Will plan on angiogram tomorrow
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
## 2018-01-31 NOTE — Procedures (Signed)
Central Venous Catheter Insertion Procedure Note MINARD MILLIRONS [id number] 1951-06-26  Procedure: Insertion of Central Venous Catheter Indications: Assessment of intravascular volume, Drug and/or fluid administration and Frequent blood sampling  Procedure Details Consent: Unable to obtain consent because of emergent medical necessity. Time Out: Verified patient identification, verified procedure, site/side was marked, verified correct patient position, special equipment/implants available, medications/allergies/relevent history reviewed, required imaging and test results available.  Performed  Maximum sterile technique was used including antiseptics, cap, gloves, gown, hand hygiene, mask and sheet. Skin prep: Chlorhexidine; local anesthetic administered A antimicrobial bonded/coated triple lumen catheter was placed in the left internal jugular vein using the Seldinger technique.  Evaluation Blood flow good Complications: No apparent complications Patient did tolerate procedure well. Chest X-ray ordered to verify placement.  CXR: pending.  Hayden Pedro, AGACNP-BC [facility]  Pgr: [phone]  PCCM Pgr: [phone]

## 2018-01-31 NOTE — Progress Notes (Signed)
EEG completed, results pending. 

## 2018-01-31 NOTE — Progress Notes (Addendum)
eLink Physician-Brief Progress Note Patient Name: Nicholas Roach DOB: 1950-08-01 MRN: [id number]   Date of Service  01/31/2018  HPI/Events of Note  Hypotension - BP = 89/50 on ceiling dose of Phenylephrine IV infusion.   eICU Interventions  Will order: 1. ABG now. 2. Norepinephrine IV infusion. Titrate to MAP > 65.  3. Ground team notified of need for CVL. 3. CVP monitoring when CVL placed.      Intervention Category Major Interventions: Hypotension - evaluation and management  Sommer,Steven Eugene 01/31/2018, 12:10 AM

## 2018-01-31 NOTE — Progress Notes (Signed)
eLink Physician-Brief Progress Note Patient Name: Nicholas Roach DOB: 1951/06/25 MRN: [id number]   Date of Service  01/31/2018  HPI/Events of Note  EKG Rhythm Change - Monitor looks like AFlutter with control ventricular response in 60's to 70's. Lead V suspicious for ST elevation.   eICU Interventions  Will order: 1. 12 Lead EKG STAT. 2. Cycle Troponin  3. BMP and Mg++ level STAT. 4. ABG STAT.      Intervention Category Major Interventions: Arrhythmia - evaluation and management  Sommer,Steven Eugene 01/31/2018, 10:12 PM

## 2018-01-31 NOTE — Progress Notes (Addendum)
eLink Physician-Brief Progress Note Patient Name: Nicholas Roach DOB: June 25, 1951 MRN: [id number]   Date of Service  01/31/2018  HPI/Events of Note  Troponin = 1.56. EKG reveals AFlutter with variable A-V Block, L axis deviation. Ventricular rate = 62. LVH with QTS widening. Can't r/o septal infarct, age undetermined. T wave abnormality, consider inferolateral ischemia. This is a very difficult situation with his recent large head bleed with IV extension [facility] vs parenchymal L basal ganglia bleed). He is not a good candidate for the cath lab and he is not even a candidate for Heparin IV infusion or ASA. Not able to B-Block this patient as he is on a Norepinephrine IV infusion. His prognosis is extremely grim.   eICU Interventions  Will order: 1. Continue to trend Troponin.        Branden Shallenberger Cornelia Copa 01/31/2018, 11:55 PM

## 2018-01-31 NOTE — Progress Notes (Signed)
CRITICAL VALUE ALERT  Critical Value:  Troponin 1.56  Date & Time Notied:  01/31/2018 2335  Provider Notified: Quentin Ore @ [id number]  Orders Received/Actions taken: awaiting MD response

## 2018-01-31 NOTE — Procedures (Signed)
Date of recording 01/31/2018  Referring physician McDowell  Reason for the study Altered mental status, subarachnoid hemorrhage  Technical Digital EEG recording using 10-20 international electrode system.  Description of the recording Posterior dominant rhythm 5 to 6 Hz symmetrical with reactivity. EEG comprised of generalized delta and theta slowing. Sleep architecture was not seen. Epileptiform features were not seen during this recording.  Impression The EEG is abnormal and findings are suggestive of moderate generalized cerebral dysfunction.  Epileptiform features were not seen during this recording

## 2018-02-01 DIAGNOSIS — R7881 Bacteremia: Secondary | ICD-10-CM

## 2018-02-01 DIAGNOSIS — I609 Nontraumatic subarachnoid hemorrhage, unspecified: Principal | ICD-10-CM

## 2018-02-01 DIAGNOSIS — Z952 Presence of prosthetic heart valve: Secondary | ICD-10-CM

## 2018-02-01 DIAGNOSIS — J9600 Acute respiratory failure, unspecified whether with hypoxia or hypercapnia: Secondary | ICD-10-CM

## 2018-02-01 DIAGNOSIS — Z9911 Dependence on respirator [ventilator] status: Secondary | ICD-10-CM

## 2018-02-01 DIAGNOSIS — I700 Atherosclerosis of aorta: Secondary | ICD-10-CM

## 2018-02-01 DIAGNOSIS — Z888 Allergy status to other drugs, medicaments and biological substances status: Secondary | ICD-10-CM

## 2018-02-01 DIAGNOSIS — R011 Cardiac murmur, unspecified: Secondary | ICD-10-CM

## 2018-02-01 DIAGNOSIS — B9562 Methicillin resistant Staphylococcus aureus infection as the cause of diseases classified elsewhere: Secondary | ICD-10-CM

## 2018-02-01 DIAGNOSIS — Z992 Dependence on renal dialysis: Secondary | ICD-10-CM

## 2018-02-01 DIAGNOSIS — N186 End stage renal disease: Secondary | ICD-10-CM

## 2018-02-01 DIAGNOSIS — I350 Nonrheumatic aortic (valve) stenosis: Secondary | ICD-10-CM

## 2018-02-01 DIAGNOSIS — Z978 Presence of other specified devices: Secondary | ICD-10-CM

## 2018-02-01 LAB — PHOSPHORUS: PHOSPHORUS: 9.5 mg/dL — AB (ref 2.5–4.6)

## 2018-02-01 LAB — MAGNESIUM: MAGNESIUM: 2.2 mg/dL (ref 1.7–2.4)

## 2018-02-01 LAB — CBC
HEMATOCRIT: 38.4 % — AB (ref 39.0–52.0)
HEMOGLOBIN: 12.6 g/dL — AB (ref 13.0–17.0)
MCH: 29.3 pg (ref 26.0–34.0)
MCHC: 32.8 g/dL (ref 30.0–36.0)
MCV: 89.3 fL (ref 78.0–100.0)
Platelets: 99 10*3/uL — ABNORMAL LOW (ref 150–400)
RBC: 4.3 MIL/uL (ref 4.22–5.81)
RDW: 16.6 % — ABNORMAL HIGH (ref 11.5–15.5)
WBC: 18.3 10*3/uL — ABNORMAL HIGH (ref 4.0–10.5)

## 2018-02-01 LAB — BASIC METABOLIC PANEL
ANION GAP: 21 — AB (ref 5–15)
BUN: 78 mg/dL — ABNORMAL HIGH (ref 8–23)
CALCIUM: 8.3 mg/dL — AB (ref 8.9–10.3)
CO2: 17 mmol/L — ABNORMAL LOW (ref 22–32)
Chloride: 101 mmol/L (ref 98–111)
Creatinine, Ser: 15.48 mg/dL — ABNORMAL HIGH (ref 0.61–1.24)
GFR, EST AFRICAN AMERICAN: 3 mL/min — AB (ref >60)
GFR, EST NON AFRICAN AMERICAN: 3 mL/min — AB (ref >60)
Glucose, Bld: 104 mg/dL — ABNORMAL HIGH (ref 70–99)
POTASSIUM: 4.2 mmol/L (ref 3.5–5.1)
Sodium: 139 mmol/L (ref 135–145)

## 2018-02-01 LAB — HCV RNA QUANT RFLX ULTRA OR GENOTYP
HCV RNA QNT(LOG COPY/ML): UNDETERMINED {Log_IU}/mL
HEPATITIS C QUANTITATION: NOT DETECTED [IU]/mL

## 2018-02-01 LAB — CULTURE, BLOOD (ROUTINE X 2)

## 2018-02-01 LAB — TROPONIN I
TROPONIN I: 1.59 ng/mL — AB (ref <0.03)
TROPONIN I: 1.49 ng/mL — AB (ref <0.03)

## 2018-02-01 MED ORDER — CHLORHEXIDINE GLUCONATE CLOTH 2 % EX PADS: 6.0000 | MEDICATED_PAD | Freq: Every day | CUTANEOUS | Status: DC

## 2018-02-01 MED ORDER — GENTAMICIN SULFATE 40 MG/ML IJ SOLN
200.0000 mg | Freq: Once | INTRAVENOUS | Status: AC
  Administered 2018-02-01: 200 mg via INTRAVENOUS
  Filled 2018-02-01 (×2): qty 5

## 2018-02-01 MED ORDER — FENTANYL CITRATE (PF) 100 MCG/2ML IJ SOLN
25.0000 ug | INTRAMUSCULAR | Status: DC | PRN
  Administered 2018-02-01: 50 ug via INTRAVENOUS
  Administered 2018-02-02 (×2): 100 ug via INTRAVENOUS
  Administered 2018-02-02: 50 ug via INTRAVENOUS
  Administered 2018-02-02 – 2018-02-03 (×2): 100 ug via INTRAVENOUS
  Filled 2018-02-01 (×6): qty 2

## 2018-02-01 MED ORDER — SODIUM CHLORIDE 0.9 % IV SOLN
300.0000 mg | Freq: Three times a day (TID) | INTRAVENOUS | Status: DC
  Administered 2018-02-01 – 2018-02-03 (×7): 300 mg via INTRAVENOUS
  Filled 2018-02-01 (×8): qty 300

## 2018-02-01 MED ORDER — MUPIROCIN 2 % EX OINT
TOPICAL_OINTMENT | Freq: Two times a day (BID) | CUTANEOUS | Status: DC
  Administered 2018-02-01 – 2018-02-04 (×7): via NASAL
  Administered 2018-02-04: 1 via NASAL
  Administered 2018-02-05: 12:00:00 via NASAL
  Filled 2018-02-01: qty 22
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
## 2018-02-01 NOTE — Progress Notes (Signed)
Patient ID: Nicholas Roach, male   DOB: 08-18-1950, 67 y.o.   MRN: [id number]         [facility]  Date of Admission:  02/24/2018            Day 4 vancomycin        Day 4 ceftriaxone ASSESSMENT: He has MRSA bacteremia and is at very high risk of endocarditis given the presence of a [employer] Jude prosthetic mitral valve and heavily calcified and stenotic (potentially bicuspid) aortic valve.  TEE is not possible now so I will go ahead and start empiric 3 drug therapy for MRSA prosthetic valve endocarditis.  PLAN: 1. Continue vancomycin 2. Start gentamicin and rifampin 3. Discontinue ceftriaxone 4. Await results of repeat blood cultures  Principal Problem:   MRSA bacteremia Active Problems:   SAH (subarachnoid hemorrhage) (HCC)   H/O mitral valve replacement   ESRD (end stage renal disease) on dialysis (HCC)   Scheduled Meds: . chlorhexidine gluconate (MEDLINE KIT)  15 mL Mouth Rinse BID  . Chlorhexidine Gluconate Cloth  6 each Topical Q0600  . mouth rinse  15 mL Mouth Rinse 10 times per day  . mupirocin ointment   Nasal BID  . niMODipine  60 mg Per Tube Q4H  . pantoprazole sodium  40 mg Per Tube Daily   Continuous Infusions: . sodium chloride Stopped (01/31/18 1335)  . sodium chloride 10 mL/hr at 02/01/18 1000  . cefTRIAXone (ROCEPHIN)  IV Stopped (02/01/18 0758)  . clevidipine Stopped (02/22/2018 2300)  . levETIRAcetam Stopped (02/01/18 0526)  . norepinephrine (LEVOPHED) Adult infusion Stopped (02/01/18 0154)   PRN Meds:.Place/Maintain arterial line **AND** sodium chloride, acetaminophen (TYLENOL) oral liquid 160 mg/5 mL  Review of Systems: Review of Systems  Unable to perform ROS: Intubated    Allergies  Allergen Reactions  . Lisinopril Other (See Comments)    cough    OBJECTIVE: Vitals:   02/01/18 0800 02/01/18 0855 02/01/18 0900 02/01/18 1000  BP: 136/61 123/61 130/64 135/63  Pulse: (!) 58 (!) 58 (!) 58 (!) 58  Resp: (!) 25 (!) 27 (!)  22 (!) 24  Temp: 99.5 F (37.5 C)     TempSrc: Axillary     SpO2: 100% 100% 100% 100%  Weight:      Height:       Body mass index is 26.11 kg/m.  Physical Exam  Constitutional:  He is unresponsive on the ventilator.  He has an extraventricular drain.  Cardiovascular: Normal rate and regular rhythm.  Murmur heard. Harsh 2/6 systolic murmur heard best at the right upper sternal border.  Pulmonary/Chest: Breath sounds normal.  Abdominal: Soft. He exhibits no distension.  Musculoskeletal:  Good thrill and left forearm graft.  Skin: No rash noted.  No splinter hemorrhages.    Lab Results Lab Results  Component Value Date   WBC 18.3 (H) 02/01/2018   HGB 12.6 (L) 02/01/2018   HCT 38.4 (L) 02/01/2018   MCV 89.3 02/01/2018   PLT 99 (L) 02/01/2018    Lab Results  Component Value Date   CREATININE 15.48 (H) 02/01/2018   BUN 78 (H) 02/01/2018   NA 139 02/01/2018   K 4.2 02/01/2018   CL 101 02/01/2018   CO2 17 (L) 02/01/2018    Lab Results  Component Value Date   ALT 19 01/31/2018   AST 32 01/31/2018   ALKPHOS 128 (H) 01/31/2018   BILITOT 0.7 01/31/2018     Microbiology: Recent Results (from the past 240  hour(s))  Culture, blood (Routine x 2)     Status: Abnormal   Collection Time: 02/17/2018 12:31 PM  Result Value Ref Range Status   Specimen Description BLOOD SITE NOT SPECIFIED  Final   Special Requests   Final    BOTTLES DRAWN AEROBIC ONLY Blood Culture results may not be optimal due to an inadequate volume of blood received in culture bottles   Culture  Setup Time   Final    GRAM POSITIVE COCCI AEROBIC BOTTLE ONLY CRITICAL RESULT CALLED TO, READ BACK BY AND VERIFIED WITH: J [facility] 01/30/18 0046 JDW Performed at [facility], [address], [place], [place] [address]    Culture METHICILLIN RESISTANT STAPHYLOCOCCUS AUREUS (A)  Final   Report Status 02/01/2018 FINAL  Final   Organism ID, Bacteria METHICILLIN RESISTANT STAPHYLOCOCCUS AUREUS  Final       Susceptibility   Methicillin resistant staphylococcus aureus - MIC*    CIPROFLOXACIN >=8 RESISTANT Resistant     ERYTHROMYCIN >=8 RESISTANT Resistant     GENTAMICIN <=0.5 SENSITIVE Sensitive     OXACILLIN >=4 RESISTANT Resistant     TETRACYCLINE <=1 SENSITIVE Sensitive     VANCOMYCIN <=0.5 SENSITIVE Sensitive     TRIMETH/SULFA <=10 SENSITIVE Sensitive     CLINDAMYCIN <=0.25 SENSITIVE Sensitive     RIFAMPIN <=0.5 SENSITIVE Sensitive     Inducible Clindamycin NEGATIVE Sensitive     * METHICILLIN RESISTANT STAPHYLOCOCCUS AUREUS  Culture, blood (Routine x 2)     Status: None (Preliminary result)   Collection Time: 02/01/2018 12:31 PM  Result Value Ref Range Status   Specimen Description BLOOD RIGHT FOREARM  Final   Special Requests   Final    BOTTLES DRAWN AEROBIC ONLY Blood Culture results may not be optimal due to an inadequate volume of blood received in culture bottles   Culture   Final    NO GROWTH 2 DAYS Performed at [facility], [place] [address] [place], [place] [address]    Report Status PENDING  Incomplete  Blood Culture ID Panel (Reflexed)     Status: Abnormal   Collection Time: 02/14/2018 12:31 PM  Result Value Ref Range Status   Enterococcus species NOT DETECTED NOT DETECTED Final   Listeria monocytogenes NOT DETECTED NOT DETECTED Final   Staphylococcus species DETECTED (A) NOT DETECTED Final    Comment: CRITICAL RESULT CALLED TO, READ BACK BY AND VERIFIED WITH: J [facility] PHARMD 01/30/18 0046 JDW    Staphylococcus aureus DETECTED (A) NOT DETECTED Final    Comment: Methicillin (oxacillin)-resistant Staphylococcus aureus (MRSA). MRSA is predictably resistant to beta-lactam antibiotics (except ceftaroline). Preferred therapy is vancomycin unless clinically contraindicated. Patient requires contact precautions if  hospitalized. CRITICAL RESULT CALLED TO, READ BACK BY AND VERIFIED WITH: J LEDF[facility] PHARMD 01/30/18 0046 JDW    Methicillin resistance DETECTED (A) NOT DETECTED  Final    Comment: CRITICAL RESULT CALLED TO, READ BACK BY AND VERIFIED WITH: J LEDF[facility] PHARMD 01/30/18 0046 JDW    Streptococcus species NOT DETECTED NOT DETECTED Final   Streptococcus agalactiae NOT DETECTED NOT DETECTED Final   Streptococcus pneumoniae NOT DETECTED NOT DETECTED Final   Streptococcus pyogenes NOT DETECTED NOT DETECTED Final   Acinetobacter baumannii NOT DETECTED NOT DETECTED Final   Enterobacteriaceae species NOT DETECTED NOT DETECTED Final   Enterobacter cloacae complex NOT DETECTED NOT DETECTED Final   Escherichia coli NOT DETECTED NOT DETECTED Final   Klebsiella oxytoca NOT DETECTED NOT DETECTED Final   Klebsiella pneumoniae NOT DETECTED  NOT DETECTED Final   Proteus species NOT DETECTED NOT DETECTED Final   Serratia marcescens NOT DETECTED NOT DETECTED Final   Haemophilus influenzae NOT DETECTED NOT DETECTED Final   Neisseria meningitidis NOT DETECTED NOT DETECTED Final   Pseudomonas aeruginosa NOT DETECTED NOT DETECTED Final   Candida albicans NOT DETECTED NOT DETECTED Final   Candida glabrata NOT DETECTED NOT DETECTED Final   Candida krusei NOT DETECTED NOT DETECTED Final   Candida parapsilosis NOT DETECTED NOT DETECTED Final   Candida tropicalis NOT DETECTED NOT DETECTED Final  MRSA PCR Screening     Status: Abnormal   Collection Time: 01/30/2018  8:12 PM  Result Value Ref Range Status   MRSA by PCR POSITIVE (A) NEGATIVE Final    Comment:        The GeneXpert MRSA Assay (FDA approved for NASAL specimens only), is one component of a comprehensive MRSA colonization surveillance program. It is not intended to diagnose MRSA infection nor to guide or monitor treatment for MRSA infections. RESULT CALLED TO, READ BACK BY AND VERIFIED WITH: Adrienne Mocha RN 01/30/18 0234 JDW     Michel Bickers, MD [facility] [phone] pager   [phone] cell 02/01/2018, 10:50 AM

## 2018-02-01 NOTE — Progress Notes (Signed)
[facility] Progress Note Patient Name: Nicholas Roach DOB: [birth year] MRN: [id number]   Date of Service  02/01/2018  HPI/Events of Note  Severe Agitation - Patient is biting down on ETT.   eICU Interventions  Will order: 1. Fentanyl 25-100 mcg IV Q 2 hours PRN agitation.      Intervention Category Major Interventions: Delirium, psychosis, severe agitation - evaluation and management  Sommer,Steven Eugene 02/01/2018, 9:26 PM

## 2018-02-01 NOTE — Progress Notes (Addendum)
Patient ID: Nicholas Roach, male   DOB: 05-24-1951, 67 y.o.   MRN: [id number]   [facility] Progress Note   Assessment/ Plan:    1.  SAH/ IV bleed: sp right frontal ventriculostomy; sp angiogram w/o evidence of aneurysm 2.  ESRD on HD: Usual TTS HD schedule. Underdialyzed due to missed HD as OP prior to admission for 1 week.  Plan regular HD in ICU today and tomorrow.   3.  Hypotension: off pressors and BP's high now 4.  Anemia of chronic kidney disease: no need for esa yet, Hb up 5.  Secondary hyperparathyroidism: Phosphorus binders on hold while intubated 6.  MRSA sepsis - seen by ID on triple Rx 7.  HTN - apparently severe on 5 BP meds at home  [place] pgr [phone]   02/01/2018, 11:13 AM  OP HD: [facility]  4h  75kg  Hep 2300 (NO heparin here due to IC bleed)  L arm AVF  - Fe 100 /hd x 10  - mircera 100 ug every 2 wks, not sure last    Subjective:   Remains off of pressors, BP's on the high side now. Temps much better.    Objective:   BP 128/74   Pulse (!) 58   Temp 99.5 F (37.5 C) (Axillary)   Resp (!) 24   Ht 5' 8"  (1.727 m)   Wt 77.9 kg (171 lb 11.8 oz)   SpO2 100%   BMI 26.11 kg/m    Home meds:  - norvasc 10/ coreg 6.25 bid/ losartan 100 qd/ hydralazine 50 qid/ cardura 8 qd  - renagel/ ppi/ mvi  - plavix 75 qd  Physical Exam: Gen: Intubated, unresponsive HLK:TGYBW regular rhythm and normal rate, 3/6 HSM over apex Resp:Coarse breath sounds, no rales/rhonchi LSL:HTDS, flat, non-tender Ext:No LE edema L arm AVG +bruit  Labs: BMET Recent Labs  Lab 02/06/2018 1231 01/25/2018 1242 01/30/18 0619 01/31/18 0040 01/31/18 0120 01/31/18 2231 02/01/18 0509  NA 136 134* 136 138 136 136 139  K 5.5* 4.7 4.3 4.1 4.2 3.7 4.2  CL 92* 93* 95*  --  97* 97* 101  CO2 25  --  24  --  21* 15* 17*  GLUCOSE 114* 113* 159*  --  154* 116* 104*  BUN 35* 51* 53*  --  67* 78* 78*  CREATININE 10.90* 10.40* 12.15*  --  13.26*  14.90* 15.48*  CALCIUM 8.8*  --  8.0*  --  7.7* 8.2* 8.3*  PHOS  --   --   --   --  8.5*  --  9.5*   CBC Recent Labs  Lab 02/22/2018 1231  02/04/2018 1342 02/20/2018 1527 01/30/18 0619 01/31/18 0040 01/31/18 0120 02/01/18 0509  WBC 12.9*  --  6.1 12.4* 11.4*  --  14.3* 18.3*  NEUTROABS 11.9*  --  5.5  --   --   --  12.6*  --   HGB 13.9   < > 6.1* 13.1 10.5* 11.2* 10.0* 12.6*  HCT 45.0   < > 21.3* 42.6 33.2* 33.0* 31.7* 38.4*  MCV 95.3  --  101.9* 94.2 92.5  --  92.4 89.3  PLT 130*  --  61* 118* 101*  --  107* 99*   < > = values in this interval not displayed.   Medications:    . chlorhexidine gluconate (MEDLINE KIT)  15 mL Mouth Rinse BID  . Chlorhexidine Gluconate Cloth  6 each Topical Q0600  .  mouth rinse  15 mL Mouth Rinse 10 times per day  . mupirocin ointment   Nasal BID  . niMODipine  60 mg Per Tube Q4H  . pantoprazole sodium  40 mg Per Tube Daily

## 2018-02-01 NOTE — Progress Notes (Signed)
Pharmacy Antibiotic Note  Nicholas Roach is a 67 y.o. male admitted on 02/21/2018 with MRSA bacteremia and is at very high risk of endocarditis.  Pharmacy has been consulted for gentamicin dosing. Patient is ESRD and usually get HD on T,Th,Sat. Nephrology has ordered an additional session for today in the ICU. Will load patient with gentamicin 3mg /kg x1 and re-dose once trough is <1.0. Patient is also on vancomycin and received a loading dose 7/5 and also a maintenance dose, although HD was held. Will plan to get a vancomycin level tomorrow morning as re-dose as appropriate. ID has also ordered rifampin as part of a triple therapy regimen.  Plan: Gentamicin 200mg  IV x1 Vancomycin with HD as appropriate Gentamicin and Vancomycin levels 02/02/18 prior to HD Monitor clinical progression   Height: 5\' 8"  (172.7 cm) Weight: 171 lb 11.8 oz (77.9 kg) IBW/kg (Calculated) : 68.4  Temp (24hrs), Avg:98.8 F (37.1 C), Min:98.3 F (36.8 C), Max:99.5 F (37.5 C)  Recent Labs  Lab 02/09/2018 1242 02/15/2018 1342 02/06/2018 1355 02/03/2018 1527 01/30/18 0619 01/31/18 0120 01/31/18 0159 01/31/18 2231 02/01/18 0509  WBC  --  6.1  --  12.4* 11.4* 14.3*  --   --  18.3*  CREATININE 10.40*  --   --   --  12.15* 13.26*  --  14.90* 15.48*  LATICACIDVEN 3.51*  --  0.86  --   --   --  1.3  --   --     Estimated Creatinine Clearance: 4.6 mL/min (A) (by C-G formula based on SCr of 15.48 mg/dL (H)).    Allergies  Allergen Reactions  . Lisinopril Other (See Comments)    cough    Antimicrobials this admission: Ceftriaxone 7/6>>7/8 Vancomycin 7/5>> Gentamicin 7/8>> Rifampin 7/8>>   Microbiology results: 7/5 BCx: MRSA   Thank you for allowing pharmacy to be a part of this patient's care.  Jodean Lima Vineeth Fell 02/01/2018 11:27 AM

## 2018-02-01 NOTE — Progress Notes (Signed)
[facility] Infusion Coordinator will follow pt with ID team to support home Infusion Pharmacy services if needed at DC.  If patient discharges after hours, please call [phone].   Nicholas Roach 02/01/2018, 6:00 PM

## 2018-02-01 NOTE — Progress Notes (Signed)
  NEUROSURGERY PROGRESS NOTE   No issues overnight. Remains intubated, unresponsive  EXAM:  BP 128/74   Pulse (!) 58   Temp 99.5 F (37.5 C) (Axillary)   Resp (!) 24   Ht 5\' 8"  (1.727 m)   Wt 77.9 kg (171 lb 11.8 oz)   SpO2 100%   BMI 26.11 kg/m   Eyes open Pupils sluggishly reactive Minimal motor responses to noxious stimuli, ?extensor posturing BUE EVD in place, functional. Low pressure  EEG interpretation reviewed, not in active status.  IMPRESSION:  67 y.o. male s/p large ICH. CTA negative. ?etiology of hemorrhage. Nonetheless his neurologic exam remains very poor and given his overall medical condition I think the chances of meaningful recovery are minimal. I therefore would not proceed with angiogram as this may be diagnostic but will not change his prognosis.  PLAN: - Cont supportive care for now  I did review the situation with the patient's sister and his daughter at the bedside this morning.  I told him that given his current neurologic condition which is been essentially unchanged since his arrival in the ICU, I think it unlikely that he is ever going to make a meaningful neurologic recovery.  I therefore discussed the option of continuing with aggressive medical care, versus taking a palliative approach and eventual one-way extubation.  Patient's family are aware of the option for more comfort type care as they went down a similar path with the patient's mother a few months ago.  They would like some time to discuss with other family before making a decision.  All their questions this morning were answered.

## 2018-02-02 ENCOUNTER — Inpatient Hospital Stay (HOSPITAL_COMMUNITY): Payer: Medicare Other

## 2018-02-02 LAB — CBC
HCT: 35.5 % — ABNORMAL LOW (ref 39.0–52.0)
Hemoglobin: 11.8 g/dL — ABNORMAL LOW (ref 13.0–17.0)
MCH: 28.9 pg (ref 26.0–34.0)
MCHC: 33.2 g/dL (ref 30.0–36.0)
MCV: 87 fL (ref 78.0–100.0)
Platelets: 109 10*3/uL — ABNORMAL LOW (ref 150–400)
RBC: 4.08 MIL/uL — AB (ref 4.22–5.81)
RDW: 16.5 % — AB (ref 11.5–15.5)
WBC: 18.9 10*3/uL — AB (ref 4.0–10.5)

## 2018-02-02 LAB — BASIC METABOLIC PANEL
Anion gap: 19 — ABNORMAL HIGH (ref 5–15)
BUN: 32 mg/dL — AB (ref 8–23)
CO2: 23 mmol/L (ref 22–32)
CREATININE: 8.14 mg/dL — AB (ref 0.61–1.24)
Calcium: 8.5 mg/dL — ABNORMAL LOW (ref 8.9–10.3)
Chloride: 96 mmol/L — ABNORMAL LOW (ref 98–111)
GFR calc Af Amer: 7 mL/min — ABNORMAL LOW (ref >60)
GFR calc non Af Amer: 6 mL/min — ABNORMAL LOW (ref >60)
Glucose, Bld: 88 mg/dL (ref 70–99)
Potassium: 3.3 mmol/L — ABNORMAL LOW (ref 3.5–5.1)
Sodium: 138 mmol/L (ref 135–145)

## 2018-02-02 LAB — GENTAMICIN LEVEL, RANDOM: Gentamicin Rm: 2.8 ug/mL

## 2018-02-02 LAB — VANCOMYCIN, RANDOM: Vancomycin Rm: 17

## 2018-02-02 LAB — MAGNESIUM: MAGNESIUM: 1.9 mg/dL (ref 1.7–2.4)

## 2018-02-02 MED ORDER — LIDOCAINE HCL (PF) 1 % IJ SOLN: 5.0000 mL | INTRAMUSCULAR | Status: DC | PRN

## 2018-02-02 MED ORDER — LIDOCAINE-PRILOCAINE 2.5-2.5 % EX CREA
1.0000 "application " | TOPICAL_CREAM | CUTANEOUS | Status: DC | PRN
  Filled 2018-02-02: qty 5

## 2018-02-02 MED ORDER — PENTAFLUOROPROP-TETRAFLUOROETH EX AERO: 1.0000 "application " | INHALATION_SPRAY | CUTANEOUS | Status: DC | PRN

## 2018-02-02 MED ORDER — HEPARIN SODIUM (PORCINE) 1000 UNIT/ML DIALYSIS: 1000.0000 [IU] | INTRAMUSCULAR | Status: DC | PRN

## 2018-02-02 MED ORDER — VANCOMYCIN HCL IN DEXTROSE 750-5 MG/150ML-% IV SOLN
750.0000 mg | Freq: Once | INTRAVENOUS | Status: AC
  Administered 2018-02-02: 750 mg via INTRAVENOUS
  Filled 2018-02-02: qty 150

## 2018-02-02 MED ORDER — SODIUM CHLORIDE 0.9 % IV SOLN: 100.0000 mL | INTRAVENOUS | Status: DC | PRN

## 2018-02-02 MED ORDER — ACETAMINOPHEN 160 MG/5ML PO SOLN
650.0000 mg | ORAL | Status: DC | PRN
  Administered 2018-02-02 – 2018-02-03 (×6): 650 mg
  Filled 2018-02-02 (×7): qty 20.3

## 2018-02-02 MED ORDER — ALTEPLASE 2 MG IJ SOLR: 2.0000 mg | Freq: Once | INTRAMUSCULAR | Status: DC | PRN

## 2018-02-02 NOTE — Progress Notes (Signed)
Pharmacy Antibiotic Note  Nicholas Roach is a 67 y.o. male admitted on 02/19/2018 with MRSA bacteremia and is at very high risk of endocarditis.  Pharmacy has been consulted for gentamicin dosing. Patient is ESRD and usually get HD on T,Th,Sat. Patient received HD session overnight and is now being prepped for another session this afternoon which should be completed around 6pm. The patient received gentamicin 3mg /kg x1 yesterday and a level this morning was 2.8. Will get a repeat level this evening ~ 4 hours post HD session. The patients Vancomycin level resulted at 17 this morning.  Will plan to re-dose this evening. ID has also ordered rifampin as part of a triple therapy regimen.  Plan: Gentamicin level ~10PM Vancomycin 750mg  IV x1 this evening  F/u HD schedule and ability to tolerate Monitor Gentamicin and Vancomycin levels as appropriate Monitor clinical progression   Height: 5\' 8"  (172.7 cm) Weight: 165 lb 5.5 oz (75 kg) IBW/kg (Calculated) : 68.4  Temp (24hrs), Avg:98.8 F (37.1 C), Min:97.9 F (36.6 C), Max:100 F (37.8 C)  Recent Labs  Lab 02/18/2018 1242  02/20/2018 1355 02/22/2018 1527 01/30/18 0619 01/31/18 0120 01/31/18 0159 01/31/18 2231 02/01/18 0509 02/02/18 0552  WBC  --    < >  --  12.4* 11.4* 14.3*  --   --  18.3* 18.9*  CREATININE 10.40*  --   --   --  12.15* 13.26*  --  14.90* 15.48* 8.14*  LATICACIDVEN 3.51*  --  0.86  --   --   --  1.3  --   --   --   VANCORANDOM  --   --   --   --   --   --   --   --   --  17  GENTRANDOM  --   --   --   --   --   --   --   --   --  2.8   < > = values in this interval not displayed.    Estimated Creatinine Clearance: 8.8 mL/min (A) (by C-G formula based on SCr of 8.14 mg/dL (H)).    Allergies  Allergen Reactions  . Lisinopril Other (See Comments)    cough    Antimicrobials this admission: Ceftriaxone 7/6>>7/8 Vancomycin 7/5>> Gentamicin 7/8>> Rifampin 7/8>>   Microbiology results: 7/5 BCx: MRSA   Thank you for  allowing pharmacy to be a part of this patient's care.  Jodean Lima Kilee Hedding 02/02/2018 1:46 PM

## 2018-02-02 NOTE — Progress Notes (Signed)
[facility] Progress Note  Subjective: had dialysis yesterday, BUN/ Creat are down significiantly  Vitals:   02/02/18 1130 02/02/18 1200 02/02/18 1252 02/02/18 1300  BP: 120/66  131/67 121/68  Pulse: 62  62 62  Resp: _0 Temp:  99.1 F (37.3 C)    TempSrc:  Axillary    SpO2: 100%  100% 100%  Weight:      Height:        Inpatient medications: . chlorhexidine gluconate (MEDLINE KIT)  15 mL Mouth Rinse BID  . mouth rinse  15 mL Mouth Rinse 10 times per day  . mupirocin ointment   Nasal BID  . niMODipine  60 mg Per Tube Q4H  . pantoprazole sodium  40 mg Per Tube Daily   . sodium chloride    . sodium chloride    . sodium chloride Stopped (01/31/18 1335)  . sodium chloride 10 mL/hr at 02/02/18 1300  . sodium chloride    . clevidipine Stopped (01/28/2018 2300)  . levETIRAcetam Stopped (02/02/18 1610)  . norepinephrine (LEVOPHED) Adult infusion Stopped (02/01/18 0154)  . rifampin (RIFADIN) IVPB Stopped (02/02/18 9604)   sodium chloride, sodium chloride, Place/Maintain arterial line **AND** sodium chloride, sodium chloride, acetaminophen (TYLENOL) oral liquid 160 mg/5 mL, alteplase, fentaNYL (SUBLIMAZE) injection, heparin, lidocaine (PF), lidocaine-prilocaine, pentafluoroprop-tetrafluoroeth  Exam: Gen: Intubated, unresponsive VWU:JWJXB regular rhythm and normal rate, 3/6 HSM over apex Resp:Coarse breath sounds, no rales/rhonchi JYN:WGNF, flat, non-tender Ext:No LE edema L arm AVG +bruit  Dialysis: [facility]  4h  75kg  Hep 2300 (NO heparin here due to IC bleed)  L arm AVF  - Fe 100 /hd x 10  - mircera 100 ug every 2 wks, not sure last   Impression/ Plan:  1.  SAH/ IV bleed: sp right frontal ventriculostomy; sp angiogram w/o evidence of aneurysm.  2.  ESRD on HD: Usual HD is TTS.  Azotemia better after HD yesterday.  Plan HD again today, then resume usual TTS schedule.  3.  Hypotension/ volume: resolved, off pressors, at dry wt today 4.  Anemia of  chronic kidney disease: no need for esa yet, Hb up 5.  Secondary hyperparathyroidism: Phosphorus binders on hold while intubated 6.  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  HTN - apparently severe on 5 BP meds at home; getting nimodipine here 8.  AMS - unresponsive, poss brain damage from cns bleed  Kelly Splinter MD [facility] pgr [phone]   02/02/2018, 1:11 PM     Recent Labs  Lab 02/24/2018 1231  01/31/18 0120  02/01/18 0509 02/02/18 0552  NA 136   < > 136   < > 139 138  K 5.5*   < > 4.2   < > 4.2 3.3*  CL 92*   < > 97*   < > 101 96*  CO2 25   < > 21*   < > 17* 23  GLUCOSE 114*   < > 154*   < > 104* 88  BUN 35*   < > 67*   < > 78* 32*  CREATININE 10.90*   < > 13.26*   < > 15.48* 8.14*  CALCIUM 8.8*   < > 7.7*   < > 8.3* 8.5*  PHOS  --   --  8.5*  --  9.5*  --   ALBUMIN 3.3*  --  2.3*  --   --   --    < > =  values in this interval not displayed.   Recent Labs  Lab 02/07/2018 1231 01/31/18 0120  AST 42* 32  ALT 17 19  ALKPHOS 62 128*  BILITOT 2.0* 0.7  PROT 8.8* 6.5   Recent Labs  Lab 02/17/2018 1342  01/31/18 0120 02/01/18 0509 02/02/18 0552  WBC 6.1   < > 14.3* 18.3* 18.9*  NEUTROABS 5.5  --  12.6*  --   --   HGB 6.1*   < > 10.0* 12.6* 11.8*  HCT 21.3*   < > 31.7* 38.4* 35.5*  MCV 101.9*   < > 92.4 89.3 87.0  PLT 61*   < > 107* 99* 109*   < > = values in this interval not displayed.   Iron/TIBC/Ferritin/ %Sat    Component Value Date/Time   IRON 40 (L) 07/03/2016 1805   TIBC 196 (L) 07/03/2016 1805   FERRITIN 305 07/03/2016 1805   IRONPCTSAT 20 07/03/2016 1805        Assessment/ Plan:        Subjective:   Remains off of pressors, BP's on the high side now. Temps much better.    Objective:   BP 121/68   Pulse 62   Temp 99.1 F (37.3 C) (Axillary)   Resp 20   Ht _0  (1.727 m)   Wt 75 kg (165 lb 5.5 oz)   SpO2 100%   BMI 25.14 kg/m    Home  meds:  - norvasc 10/ coreg 6.25 bid/ losartan 100 qd/ hydralazine 50 qid/ cardura 8 qd  - renagel/ ppi/ mvi  - plavix 75 qd

## 2018-02-02 NOTE — Progress Notes (Signed)
Patient ID: Nicholas Roach, male   DOB: 25-May-1951, 67 y.o.   MRN: [id number]          [facility] for Infectious Disease    Date of Admission:  02/15/2018   Total days of antibiotics 5         He remains on therapy for MRSA bacteremia and presumed endocarditis.  He is afebrile and repeat blood cultures are negative at 48 hours but his prognosis for meaningful neurologic recovery is extremely poor.  I will continue vancomycin, gentamicin and rifampin pending further discussion of goals of care with his family.  Michel Bickers, MD [facility] for Infectious [place] [facility] [phone] pager   [phone] cell 02/02/2018, 2:17 PM

## 2018-02-02 NOTE — Progress Notes (Signed)
  NEUROSURGERY PROGRESS NOTE   No issues overnight.   EXAM:  BP (!) 137/52 Comment (BP Location): arterial line   Pulse (!) 58   Temp 97.7 F (36.5 C) (Axillary)   Resp (!) 21   Ht 5\' 8"  (1.727 m)   Wt 74.8 kg (164 lb 14.5 oz)   SpO2 100%   BMI 25.07 kg/m   Opens eyes Pupils sluggishly responsive Not breathing over vent No motor responses EVD in place  IMPRESSION:  67 y.o. male with large SAH/IVH/IPH with poor neurologic exam which has not improved since admission. Have r/o SZ, getting dialysis now but doubt AMS is due to metabolic encephalopathy, and has been on abx but again doubt this is related to sepsis. I think his prognosis for meaningful recovery is dismal.  PLAN: - Assuming no improvement in the next day, family has indicated they will likely change to comfort care with terminal wean/one-way extubation.  I have again reviewed the situation with the patient's sister and son at the bedside today. I told them that I do not think he has any reasonable chance of recovery. They indicated that they have already discussed the situation among family and plan on transitioning to comfort care/extubation tomorrow assuming no change in condition. All their questions were answered today.
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
## 2018-02-02 NOTE — Progress Notes (Signed)
PCCM Interval Note  I had opportunity to meet with the patient's family at bedside. His sister, sister-in-law, daughter and son were present. Explained the current situation, prognosis, and answered their questions.   They understand the rationale of treating his infection, performing his HD in order to insure that we have eliminated other potential causes of his altered MS. Unfortunately he has not had any significant improvement in MS.   We will review his prognosis w Dr Kathyrn Sheriff now that HD has been completed. Suspect that chances for a meaningful recovery here remain grim. Based on this we broached the subject of withdrawal of MV and transition to comfort care. I believe that this will be the plan should Dr Kathyrn Sheriff agree.   In the meantime, I have recommended that we defer any invasive maneuvers, CPR, etc should he decompensate. They agree. I wil place DNR orders in the chart.   Independent CC time 30 minutes  Baltazar Apo, MD, PhD 02/02/2018, 1:15 PM [facility] [phone] or if no answer [phone]

## 2018-02-03 DIAGNOSIS — J9600 Acute respiratory failure, unspecified whether with hypoxia or hypercapnia: Secondary | ICD-10-CM | POA: Diagnosis present

## 2018-02-03 LAB — GENTAMICIN LEVEL, TROUGH: GENTAMICIN TR: 1.3 ug/mL (ref 0.5–2.0)

## 2018-02-03 LAB — BASIC METABOLIC PANEL
Anion gap: 18 — ABNORMAL HIGH (ref 5–15)
BUN: 28 mg/dL — ABNORMAL HIGH (ref 8–23)
CO2: 21 mmol/L — ABNORMAL LOW (ref 22–32)
Calcium: 8.2 mg/dL — ABNORMAL LOW (ref 8.9–10.3)
Chloride: 100 mmol/L (ref 98–111)
Creatinine, Ser: 7.45 mg/dL — ABNORMAL HIGH (ref 0.61–1.24)
GFR, EST AFRICAN AMERICAN: 8 mL/min — AB (ref >60)
GFR, EST NON AFRICAN AMERICAN: 7 mL/min — AB (ref >60)
GLUCOSE: 105 mg/dL — AB (ref 70–99)
POTASSIUM: 4 mmol/L (ref 3.5–5.1)
Sodium: 139 mmol/L (ref 135–145)

## 2018-02-03 LAB — CBC
HEMATOCRIT: 35.2 % — AB (ref 39.0–52.0)
HEMOGLOBIN: 11.4 g/dL — AB (ref 13.0–17.0)
MCH: 29.3 pg (ref 26.0–34.0)
MCHC: 32.4 g/dL (ref 30.0–36.0)
MCV: 90.5 fL (ref 78.0–100.0)
Platelets: 98 10*3/uL — ABNORMAL LOW (ref 150–400)
RBC: 3.89 MIL/uL — ABNORMAL LOW (ref 4.22–5.81)
RDW: 17.2 % — AB (ref 11.5–15.5)
WBC: 17.5 10*3/uL — AB (ref 4.0–10.5)

## 2018-02-03 LAB — CULTURE, BLOOD (ROUTINE X 2): CULTURE: NO GROWTH

## 2018-02-03 LAB — MAGNESIUM: Magnesium: 2.1 mg/dL (ref 1.7–2.4)

## 2018-02-03 MED ORDER — MORPHINE 100MG IN NS 100ML (1MG/ML) PREMIX INFUSION
1.0000 mg/h | INTRAVENOUS | Status: DC
  Administered 2018-02-03: 1 mg/h via INTRAVENOUS
  Administered 2018-02-05: 5 mg/h via INTRAVENOUS
  Administered 2018-02-05: 2 mg/h via INTRAVENOUS
  Filled 2018-02-03 (×2): qty 100

## 2018-02-03 NOTE — Progress Notes (Signed)
  NEUROSURGERY PROGRESS NOTE   No issues overnight. No change after HD yesterday  EXAM:  BP (!) 187/61   Pulse (!) 57   Temp (!) 102.5 F (39.2 C) (Axillary)   Resp (!) 21   Ht 5\' 8"  (1.727 m)   Wt 73.3 kg (161 lb 9.6 oz)   SpO2 100%   BMI 24.57 kg/m   Minimal eye opening Not breathing over vent Extensor posturing on left EVD in place, functional.  IMPRESSION:  67 y.o. male s/p large SAH, remains comatose without neurologic improvement. Reversible causes of obtundation have been ruled out. It is my feeling that his prognosis for recovery is dismal.  PLAN: - Family will decide about transitioning to comfort care.

## 2018-02-03 NOTE — Progress Notes (Signed)
PCCM Interval Note  I spoke with the patient's sister, son, daughter at bedside.  They have been considering Mr. Frappier clinical condition, prognosis and his goals for care.  They have agreed that we will plan to withdraw extraordinary support and transition to comfort.  They would like to do this on 02/12/2018, Friday.  I supported them in this decision.  I will start some low-dose morphine for comfort.  We will plan to extubate on 7/12 when the family is ready.  Defer any further dialysis.  I will try to eliminate any non-comfort based medications as able.  Baltazar Apo, MD, PhD 02/03/2018, 2:19 PM [facility] [phone] or if no answer [phone]

## 2018-02-03 NOTE — Progress Notes (Signed)
Pharmacy Antibiotic Note  Nicholas Roach is a 67 y.o. male admitted on 02/23/2018 with MRSA bacteremia and is at very high risk of endocarditis.  Pharmacy has been consulted for gentamicin dosing. Patient is ESRD and usually get HD on T,Th,Sat. Patient received two HD sessions on 7/9. The family is now leaning toward comfort care only measures and is not scheduled for any further HD.  The patient received gentamicin 3mg /kg x1 7/8 and a level post two HD sessions is 1.2. ID has discontinued the Gentamicin and Rifampin at this time. He received a dose of vancomycin yesterday with HD and will only need additional doses if he continues more HD.  Plan: F/u HD schedule and ability to tolerate F/U GOC   Height: 5\' 8"  (172.7 cm) Weight: 161 lb 9.6 oz (73.3 kg) IBW/kg (Calculated) : 68.4  Temp (24hrs), Avg:100.2 F (37.9 C), Min:97.9 F (36.6 C), Max:102.5 F (39.2 C)  Recent Labs  Lab 01/26/2018 1242  02/22/2018 1355  01/30/18 0619 01/31/18 0120 01/31/18 0159 01/31/18 2231 02/01/18 0509 02/02/18 0552 02/02/18 2319 02/03/18 0500  WBC  --    < >  --    < > 11.4* 14.3*  --   --  18.3* 18.9*  --  17.5*  CREATININE 10.40*  --   --   --  12.15* 13.26*  --  14.90* 15.48* 8.14*  --  7.45*  LATICACIDVEN 3.51*  --  0.86  --   --   --  1.3  --   --   --   --   --   VANCORANDOM  --   --   --   --   --   --   --   --   --  17  --   --   GENTTROUGH  --   --   --   --   --   --   --   --   --   --  1.3  --   GENTRANDOM  --   --   --   --   --   --   --   --   --  2.8  --   --    < > = values in this interval not displayed.    Estimated Creatinine Clearance: 9.6 mL/min (A) (by C-G formula based on SCr of 7.45 mg/dL (H)).    Allergies  Allergen Reactions  . Lisinopril Other (See Comments)    cough    Antimicrobials this admission: Ceftriaxone 7/6>>7/8 Vancomycin 7/5>> Gentamicin 7/8>>7/10 Rifampin 7/8>>7/10   Microbiology results: 7/5 BCx: MRSA   Thank you for allowing pharmacy to be a part  of this patient's care.  Jodean Lima Derec Mozingo 02/03/2018 3:06 PM
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
## 2018-02-03 NOTE — Progress Notes (Addendum)
EOL discussions underway and family moving towards comfort care given lack of neurological improvement.  No further HD anticipated.  Will sign off.  Kelly Splinter MD [employer] pgr [phone]   02/03/2018, 3:05 PM

## 2018-02-03 NOTE — Progress Notes (Signed)
Patient ID: Nicholas Roach, male   DOB: May 24, 1951, 67 y.o.   MRN: [id number]         [facility]    Date of Admission:  02/19/2018   Total days of antibiotics 6         He remains on therapy for MRSA bacteremia and presumed endocarditis.  His family has decided to transition to comfort measures with plan to extubate on 03-06-2018.  He will not undergo any further hemodialysis.  I will continue vancomycin but see no benefit to continuing gentamicin and rifampin at this point.  Please call if I can be of further assistance.  Michel Bickers, MD [facility] [phone] pager   [phone] cell 02/03/2018, 2:42 PM

## 2018-02-04 ENCOUNTER — Encounter (HOSPITAL_COMMUNITY): Payer: Self-pay
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
## 2018-02-04 NOTE — Progress Notes (Signed)
eLink Physician-Brief Progress Note Patient Name: Nicholas Roach DOB: Dec 06, 1950 MRN: [id number]   Date of Service  02/04/2018  HPI/Events of Note  Patient made DNR with plan to compassionately extubate and transition to comfort measures February 08, 2018. RN wanting to know if he should titrate up pressor for low MAP  eICU Interventions  Do not escalate care given DNR status with plans to extubate and transition to comfort care within the next 24 hours.        Kerry Kass Ogan 02/04/2018, 12:33 AM

## 2018-02-04 NOTE — Progress Notes (Signed)
Patient transitioned to comfort care with extubation tomorrow. EVD removed today. 2 staples placed at opening. No complications.

## 2018-02-04 NOTE — Progress Notes (Addendum)
Nutrition Brief Note  Chart reviewed. See full assessment 7/6. Pt discussed during ICU rounds and with RN. Per MD plan to transition to comfort care on 7/12. No nutrition interventions planned at this time.  Please re-consult as needed.   Cook, [place], [place] Pager [phone] After Hours Pager

## 2018-02-05 LAB — CULTURE, BLOOD (ROUTINE X 2)
Culture: NO GROWTH
Culture: NO GROWTH
SPECIAL REQUESTS: ADEQUATE

## 2018-02-05 MED ORDER — LORAZEPAM 2 MG/ML IJ SOLN
1.0000 mg | INTRAMUSCULAR | Status: DC | PRN
  Administered 2018-02-05: 1 mg via INTRAVENOUS

## 2018-02-05 MED ORDER — VANCOMYCIN VARIABLE DOSE PER UNSTABLE RENAL FUNCTION (PHARMACIST DOSING): Status: DC

## 2018-02-05 MED ORDER — ATROPINE SULFATE 1 % OP SOLN
2.0000 [drp] | Freq: Four times a day (QID) | OPHTHALMIC | Status: DC | PRN
  Filled 2018-02-05: qty 2

## 2018-02-05 MED ORDER — LORAZEPAM 2 MG/ML IJ SOLN
INTRAMUSCULAR | Status: AC
  Filled 2018-02-05: qty 1

## 2018-02-09 ENCOUNTER — Telehealth: Payer: Self-pay

## 2018-02-09 NOTE — Telephone Encounter (Signed)
On 02/09/18 I received a d/c from [facility] (original). The d/c is for cremation. The patient is a patient of Doctor Byrum.   The d/c will be taken to [address] for signature.   On 02/11/18 I received the d/c back from Doctor Byrum.  I got the d/c ready and called the funeral home to let them know the d/c is ready for pickup.  I also faxed a copy to the funeral home per the funeral home request.

## 2018-02-11 LAB — DRUG SCREEN 10 W/CONF, SERUM
Amphetamines, IA: NEGATIVE ng/mL
BENZODIAZEPINES, IA: NEGATIVE ng/mL
Barbiturates, IA: NEGATIVE ug/mL
COCAINE & METABOLITE, IA: POSITIVE ng/mL
Methadone, IA: NEGATIVE ng/mL
OPIATES, IA: NEGATIVE ng/mL
Oxycodones, IA: NEGATIVE ng/mL
Phencyclidine, IA: NEGATIVE ng/mL
Propoxyphene, IA: NEGATIVE ng/mL
THC(MARIJUANA) METABOLITE, IA: NEGATIVE ng/mL

## 2018-02-11 LAB — COCAINE,MS,WB/SP RFX
Benzoylecgonine: 1359 ng/mL
Cocaine Confirmation: POSITIVE
Cocaine: NEGATIVE ng/mL

## 2018-02-17 DIAGNOSIS — Z0389 Encounter for observation for other suspected diseases and conditions ruled out: Secondary | ICD-10-CM

## 2018-02-17 DIAGNOSIS — A419 Sepsis, unspecified organism: Secondary | ICD-10-CM | POA: Diagnosis not present

## 2018-02-17 DIAGNOSIS — R569 Unspecified convulsions: Secondary | ICD-10-CM

## 2018-02-17 DIAGNOSIS — E46 Unspecified protein-calorie malnutrition: Secondary | ICD-10-CM | POA: Diagnosis present

## 2018-02-17 DIAGNOSIS — G9340 Encephalopathy, unspecified: Secondary | ICD-10-CM | POA: Diagnosis present

## 2018-02-17 DIAGNOSIS — Z9889 Other specified postprocedural states: Secondary | ICD-10-CM

## 2018-02-17 DIAGNOSIS — R6521 Severe sepsis with septic shock: Secondary | ICD-10-CM

## 2018-02-17 DIAGNOSIS — I214 Non-ST elevation (NSTEMI) myocardial infarction: Secondary | ICD-10-CM | POA: Diagnosis not present

## 2018-02-17 DIAGNOSIS — I4892 Unspecified atrial flutter: Secondary | ICD-10-CM | POA: Diagnosis present

## 2018-02-25 NOTE — Progress Notes (Signed)
   02/10/2018 1200  Clinical Encounter Type  Visited With Family  Visit Type Follow-up (End of Life)  Referral From Nurse  Consult/Referral To Chaplain  Spiritual Encounters  Spiritual Needs Emotional;Ritual;Prayer  Stress Factors  Family Stress Factors Loss  Chaplain was able to visit with the family a couple of times in order to help console during EOL event.  The Chaplain was able to establish a repoire with the family.  In the time Chaplain was in the room family was able to share in memories and life and legacy of the  PT who is still living. The family is awaiting the extubation.Marland KitchenMarland KitchenChaplain prayed with the PT family.

## 2018-02-25 NOTE — Progress Notes (Signed)
Chaplain visited with family after PT has passed for spiritual support.  The family seems to be doing well.  Chaplain gave family the PT placement card and provided information about next steps.  Nurse was going to provide [facility] contact sheet to daughter of PT.

## 2018-02-25 NOTE — Progress Notes (Signed)
Family ready to progress patient to full comfort care at this time. CCM MD and RT notified. Orders received. Family informed of process and ample time given for questions. Family, RN and RT at bedside for extubation. Patient extubated to room air with no complications and patient resting comfortably in NAD. Patient passed away peacefully at 45 with family and RN at bedside. Pronounced by Weston Brass, RN and Page Peggye Ley, RN. Family expressed gratitude to staff and appreciative of the care and respect they and there family member received.   CDS representative Shawna Clamp contacted with cardiac time of death and stated patient was not a donor candidate. CCM MD notified of patient's passing.  Neurosurgeon contacted about cause of SAH and deemed it to be a non-traumatic SAH. ME contacted and deemed the patient not to be a ME case due to the non-traumatic cause of his SAH.    Wasted 45 ml of morphine and 1 mg ativan in the sink. Witnessed by Rogers Seeds, RN.
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
## 2018-02-25 NOTE — Procedures (Signed)
Extubation Procedure Note  Patient Details:   Name: Nicholas Roach DOB: Sep 16, 1950 MRN: [id number]   Airway Documentation:    Vent end date: 2018/02/10 Vent end time: 1315   Evaluation  O2 sats: stable throughout Complications: No apparent complications Patient did tolerate procedure well. Bilateral Breath Sounds: Clear, Diminished   No- pt is one way extubation. Unable to speak. PT extubated with RNs and family at bedside.   Elwin Mocha 10-Feb-2018, 1:19 PM

## 2018-02-25 DEATH — deceased

## 2020-03-14 IMAGING — CT CT HEAD W/O CM
3 of 4 series · 13 of 47 positions shown, 15 images · non-contrast
Comparison: Prior CT from 01/30/2018

CLINICAL DATA: Follow-up examination for intracranial hemorrhage.

EXAM:
CT HEAD WITHOUT CONTRAST
TECHNIQUE: Contiguous axial images were obtained from the base of the skull
through the vertex without intravenous contrast.

[Series 3: head without · axial · non-contrast · 0.45mm/px · z∈[-86,+49]mm · 7 of 37 slices shown, 9 images]
[im 5/37  brain]
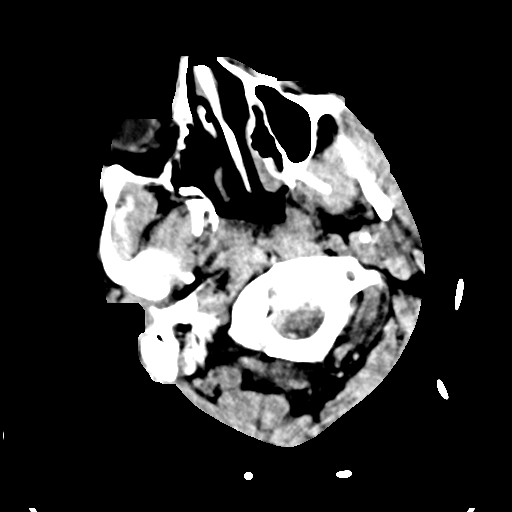
[im 5/37  bone]
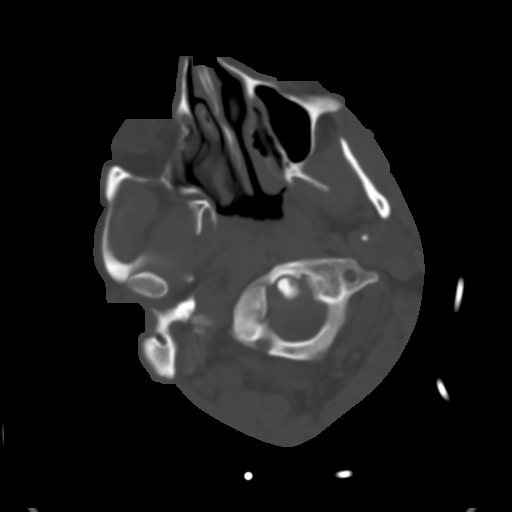
[im 10/37  brain]
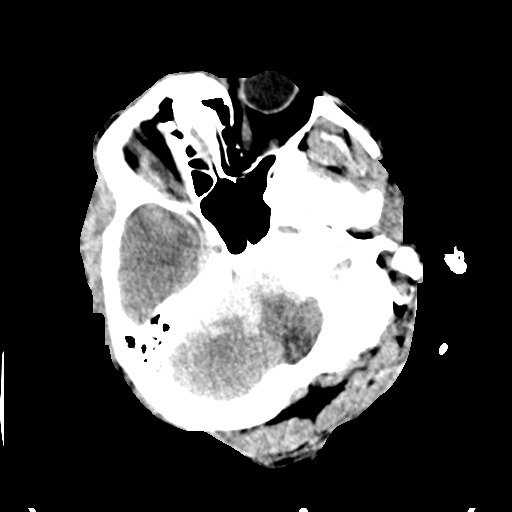
[im 14/37  brain]
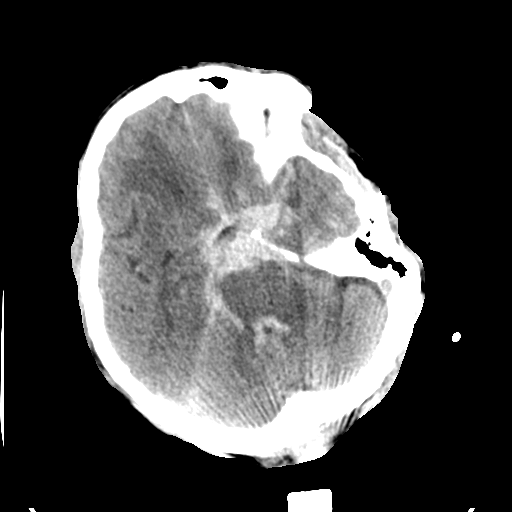
[im 19/37  brain]
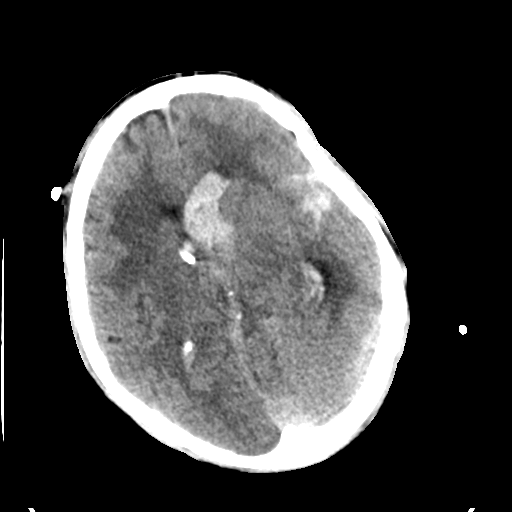
[im 23/37  brain]
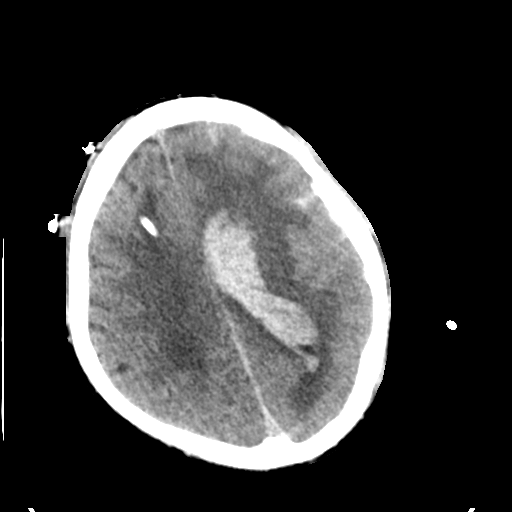
[im 23/37  bone]
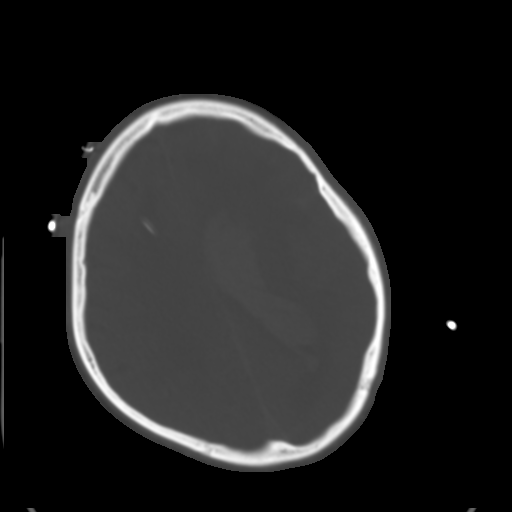
[im 28/37  brain]
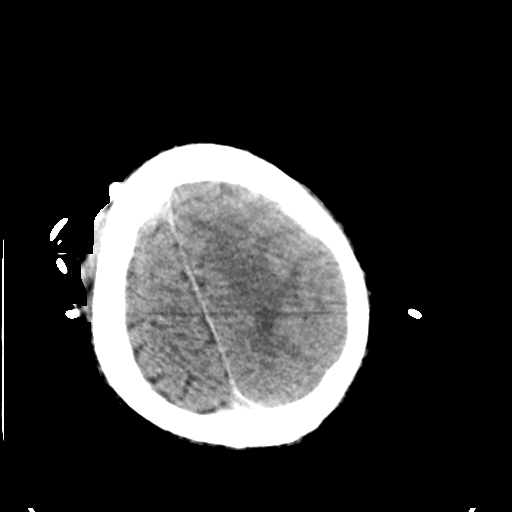
[im 32/37  brain]
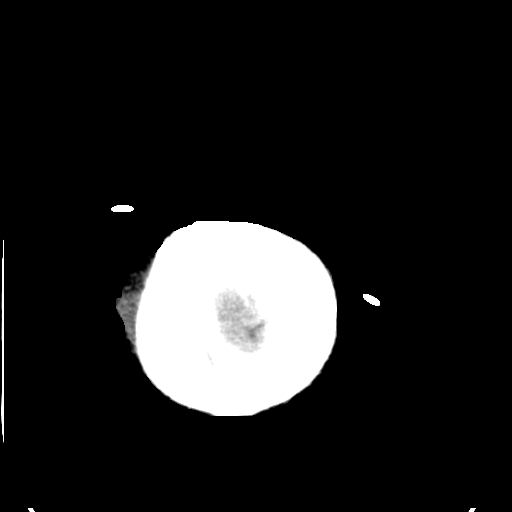

[Series 5: head without cor · coronal · non-contrast · 0.36mm/px · 3 of 77 slices shown]
[im 26/77  brain]
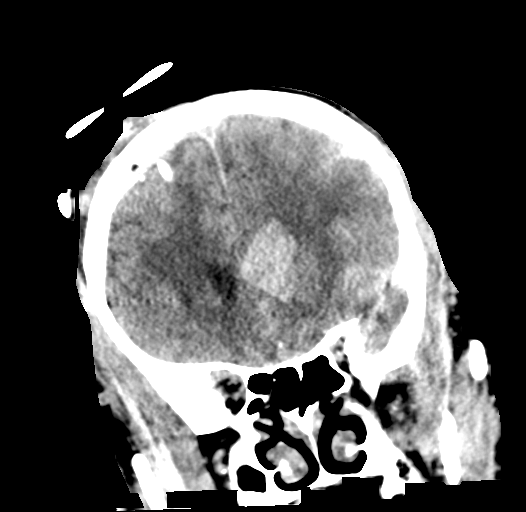
[im 34/77  brain]
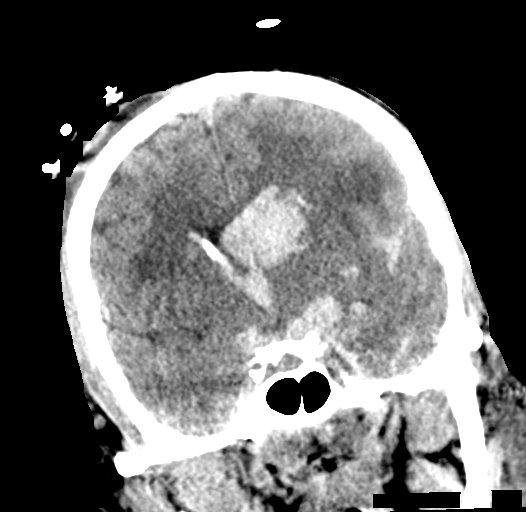
[im 43/77  brain]
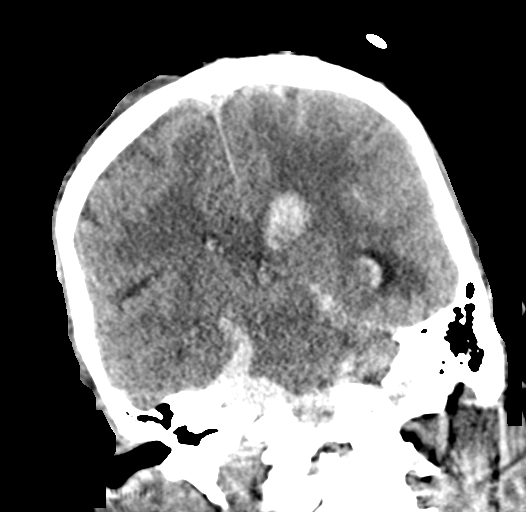

[Series 6: head without sag · sagittal · non-contrast · 0.36mm/px · 3 of 67 slices shown]
[im 23/67  brain]
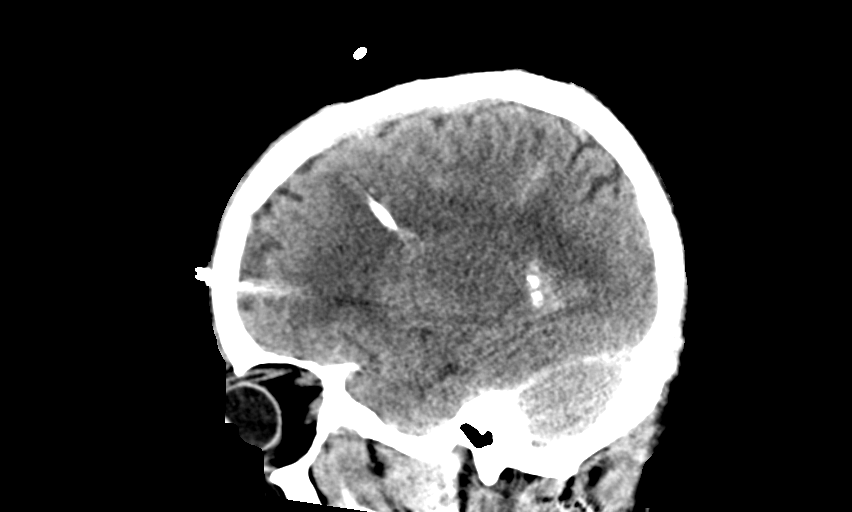
[im 34/67  brain]
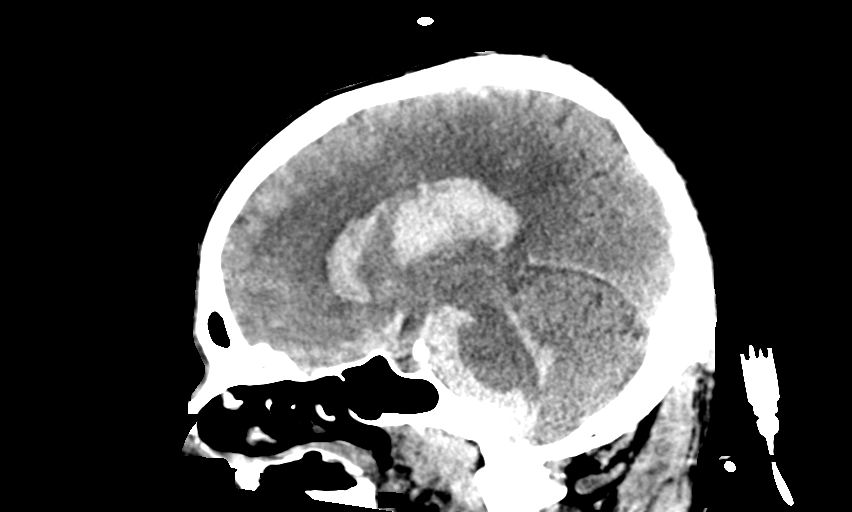
[im 45/67  brain]
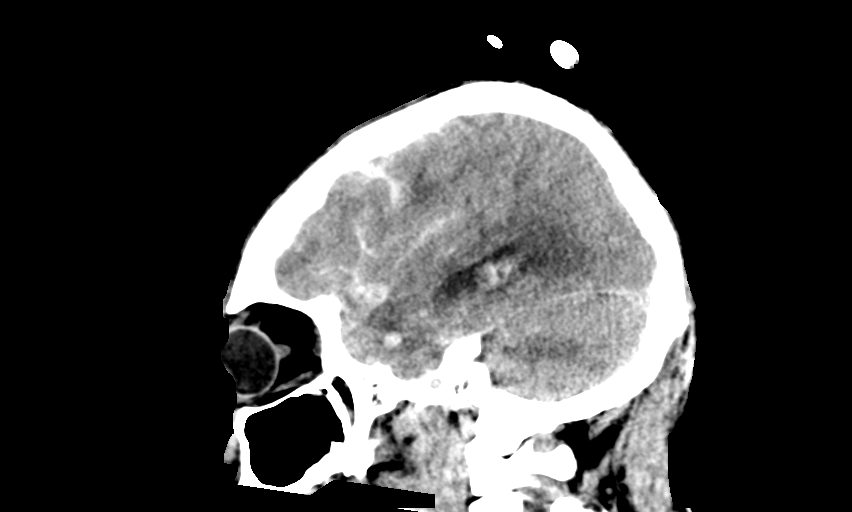

[13 of 47 positions shown; findings below may reference images not displayed]

FINDINGS: Brain: Acute intraparenchymal hemorrhage centered at the left basal
ganglia again seen, similar in size allowing for differences in
technique. Hemorrhage measures 3.7 x 2.0 x 2.4 cm on current exam.
Intraventricular extension with blood throughout the ventricular
system again seen, stable from previous. Large volume subarachnoid
hemorrhage at the left frontotemporal region and throughout the
basilar cisterns, relatively unchanged.

Right frontal approach ventriculostomy remains in place with tip
terminating near the right lateral ventricle. Hemorrhage again noted
within the right thalamus distal to the ventriculostomy. The
ventricles are decompressed as compared to previous, although there
remains persistent mild dilatation of the temporal horn of the left
lateral ventricle. Small focus of pneumocephalus at the anterior
right frontal horn. Trace extra-axial hemorrhage measuring up to 3
mm overlies the right frontal convexity.

Left-to-right midline shift slightly increased measuring 11 mm,
previously 7 mm. This may in part be related to the decompressed
right lateral ventricle.

Gray-white matter differentiation maintained with no acute or
interval infarction identified.

Vascular: No obvious change, although evaluation limited by
extensive subarachnoid hemorrhage.

Skull: Right frontal ventriculostomy with associated scalp swelling
and hematoma.

Sinuses/Orbits: Dysconjugate gaze with left greater than right
esotropia. Remote defect at the left lamina papyracea noted.
Scattered mucosal thickening within the visualized paranasal
sinuses. Mastoid air cells remain clear.

Other: None.
IMPRESSION: 1. Stable positioning of right frontal approach ventriculostomy
catheter with interval decompression of the lateral ventricles and
improved hydrocephalus. Persistent dilatation of the temporal horn
of the left lateral ventricle concerning for ventricular trapping.
2. Extensive subarachnoid, intraventricular, and intraparenchymal
hemorrhage, overall relatively similar as compared to previous.
3. Slightly worsened localized left-to-right midline shift at the
septum pellucidum, now measuring up to 11 mm, previously 7 mm.
Findings suspected to at least in part be secondary to interval
decompression of the adjacent right lateral ventricle.
